# Patient Record
Sex: Female | Born: 1975 | Race: Black or African American | Hispanic: No | Marital: Married | Smoking: Never smoker
Health system: Southern US, Community
[De-identification: ages and names within clinical notes are randomized; demographics above are authoritative.]

## PROBLEM LIST (undated history)

## (undated) DIAGNOSIS — E119 Type 2 diabetes mellitus without complications: Secondary | ICD-10-CM

## (undated) DIAGNOSIS — I2699 Other pulmonary embolism without acute cor pulmonale: Secondary | ICD-10-CM

## (undated) HISTORY — PX: OTHER SURGICAL HISTORY: SHX169

## (undated) HISTORY — PX: ABDOMINAL HYSTERECTOMY: SHX81

## (undated) HISTORY — PX: NASAL SINUS SURGERY: SHX719

---

## 2004-08-18 ENCOUNTER — Emergency Department: Payer: Self-pay | Admitting: Unknown Physician Specialty

## 2005-02-06 ENCOUNTER — Emergency Department: Payer: Self-pay | Admitting: Internal Medicine

## 2005-04-30 ENCOUNTER — Ambulatory Visit: Payer: Self-pay | Admitting: *Deleted

## 2005-05-15 ENCOUNTER — Ambulatory Visit: Payer: Self-pay | Admitting: Obstetrics & Gynecology

## 2005-05-15 ENCOUNTER — Ambulatory Visit (HOSPITAL_COMMUNITY): Admission: RE | Admit: 2005-05-15 | Discharge: 2005-05-15 | Payer: Self-pay | Admitting: *Deleted

## 2005-05-23 ENCOUNTER — Inpatient Hospital Stay (HOSPITAL_COMMUNITY): Admission: AD | Admit: 2005-05-23 | Discharge: 2005-05-23 | Payer: Self-pay | Admitting: Obstetrics and Gynecology

## 2005-06-12 ENCOUNTER — Ambulatory Visit: Payer: Self-pay | Admitting: Obstetrics & Gynecology

## 2005-07-10 ENCOUNTER — Ambulatory Visit: Payer: Self-pay | Admitting: Obstetrics & Gynecology

## 2005-07-19 ENCOUNTER — Ambulatory Visit: Payer: Self-pay | Admitting: *Deleted

## 2005-07-19 ENCOUNTER — Ambulatory Visit (HOSPITAL_COMMUNITY): Admission: RE | Admit: 2005-07-19 | Discharge: 2005-07-19 | Payer: Self-pay | Admitting: *Deleted

## 2005-07-30 ENCOUNTER — Ambulatory Visit (HOSPITAL_COMMUNITY): Admission: RE | Admit: 2005-07-30 | Discharge: 2005-07-30 | Payer: Self-pay | Admitting: *Deleted

## 2005-08-05 ENCOUNTER — Ambulatory Visit: Payer: Self-pay | Admitting: Obstetrics & Gynecology

## 2005-08-06 ENCOUNTER — Ambulatory Visit (HOSPITAL_COMMUNITY): Admission: RE | Admit: 2005-08-06 | Discharge: 2005-08-06 | Payer: Self-pay | Admitting: *Deleted

## 2005-08-15 ENCOUNTER — Ambulatory Visit: Payer: Self-pay | Admitting: *Deleted

## 2005-08-26 ENCOUNTER — Ambulatory Visit: Payer: Self-pay | Admitting: *Deleted

## 2005-09-09 ENCOUNTER — Ambulatory Visit: Payer: Self-pay | Admitting: Family Medicine

## 2005-09-24 ENCOUNTER — Inpatient Hospital Stay (HOSPITAL_COMMUNITY): Admission: AD | Admit: 2005-09-24 | Discharge: 2005-09-24 | Payer: Self-pay | Admitting: Family Medicine

## 2005-09-24 ENCOUNTER — Ambulatory Visit: Payer: Self-pay | Admitting: Family Medicine

## 2005-09-26 ENCOUNTER — Ambulatory Visit (HOSPITAL_COMMUNITY): Admission: RE | Admit: 2005-09-26 | Discharge: 2005-09-26 | Payer: Self-pay | Admitting: *Deleted

## 2005-09-26 ENCOUNTER — Ambulatory Visit: Payer: Self-pay | Admitting: Gynecology

## 2005-10-10 ENCOUNTER — Ambulatory Visit: Payer: Self-pay | Admitting: Gynecology

## 2005-10-24 ENCOUNTER — Ambulatory Visit: Payer: Self-pay | Admitting: *Deleted

## 2005-10-24 ENCOUNTER — Ambulatory Visit (HOSPITAL_COMMUNITY): Admission: RE | Admit: 2005-10-24 | Discharge: 2005-10-24 | Payer: Self-pay | Admitting: Obstetrics & Gynecology

## 2005-10-31 ENCOUNTER — Ambulatory Visit: Payer: Self-pay | Admitting: Gynecology

## 2005-11-07 ENCOUNTER — Ambulatory Visit: Payer: Self-pay | Admitting: Gynecology

## 2005-11-14 ENCOUNTER — Ambulatory Visit: Payer: Self-pay | Admitting: Family Medicine

## 2005-11-21 ENCOUNTER — Ambulatory Visit: Payer: Self-pay | Admitting: Gynecology

## 2005-11-25 ENCOUNTER — Ambulatory Visit (HOSPITAL_COMMUNITY): Admission: RE | Admit: 2005-11-25 | Discharge: 2005-11-25 | Payer: Self-pay | Admitting: Obstetrics & Gynecology

## 2005-11-25 ENCOUNTER — Ambulatory Visit: Payer: Self-pay | Admitting: Obstetrics & Gynecology

## 2005-11-28 ENCOUNTER — Ambulatory Visit: Payer: Self-pay | Admitting: Family Medicine

## 2005-12-02 ENCOUNTER — Ambulatory Visit (HOSPITAL_COMMUNITY): Admission: RE | Admit: 2005-12-02 | Discharge: 2005-12-02 | Payer: Self-pay | Admitting: Obstetrics and Gynecology

## 2005-12-02 ENCOUNTER — Ambulatory Visit: Payer: Self-pay | Admitting: Family Medicine

## 2005-12-03 ENCOUNTER — Ambulatory Visit (HOSPITAL_COMMUNITY): Admission: RE | Admit: 2005-12-03 | Discharge: 2005-12-03 | Payer: Self-pay | Admitting: Family Medicine

## 2005-12-05 ENCOUNTER — Ambulatory Visit: Payer: Self-pay | Admitting: Gynecology

## 2005-12-06 ENCOUNTER — Inpatient Hospital Stay (HOSPITAL_COMMUNITY): Admission: AD | Admit: 2005-12-06 | Discharge: 2005-12-09 | Payer: Self-pay | Admitting: Obstetrics & Gynecology

## 2005-12-06 ENCOUNTER — Ambulatory Visit: Payer: Self-pay | Admitting: Obstetrics & Gynecology

## 2005-12-06 DIAGNOSIS — D689 Coagulation defect, unspecified: Secondary | ICD-10-CM | POA: Insufficient documentation

## 2005-12-10 ENCOUNTER — Encounter: Admission: RE | Admit: 2005-12-10 | Discharge: 2006-01-09 | Payer: Self-pay | Admitting: Unknown Physician Specialty

## 2005-12-13 ENCOUNTER — Ambulatory Visit: Payer: Self-pay | Admitting: Gynecology

## 2005-12-13 ENCOUNTER — Inpatient Hospital Stay: Payer: Self-pay | Admitting: Internal Medicine

## 2006-01-10 ENCOUNTER — Encounter: Admission: RE | Admit: 2006-01-10 | Discharge: 2006-01-17 | Payer: Self-pay | Admitting: Unknown Physician Specialty

## 2006-01-17 ENCOUNTER — Ambulatory Visit: Payer: Self-pay | Admitting: Gynecology

## 2006-01-30 ENCOUNTER — Ambulatory Visit: Payer: Self-pay | Admitting: Obstetrics and Gynecology

## 2006-05-08 ENCOUNTER — Emergency Department: Payer: Self-pay | Admitting: Emergency Medicine

## 2006-05-08 ENCOUNTER — Other Ambulatory Visit: Payer: Self-pay

## 2006-10-10 ENCOUNTER — Ambulatory Visit: Payer: Self-pay | Admitting: Otolaryngology

## 2006-10-14 ENCOUNTER — Ambulatory Visit: Payer: Self-pay | Admitting: Otolaryngology

## 2007-05-02 IMAGING — US US OB FOLLOW-UP
1 series · 13 of 24 positions shown · non-contrast
Comparison: none

CLINICAL DATA: Assigned gestational age is 37 weeks 4 days; oligohydramnios; gestational diabetes; evaluate growth.

[Series 1: us ob follow-up · 0.39mm/px · 13 of 24 slices shown]
[im 1/24]
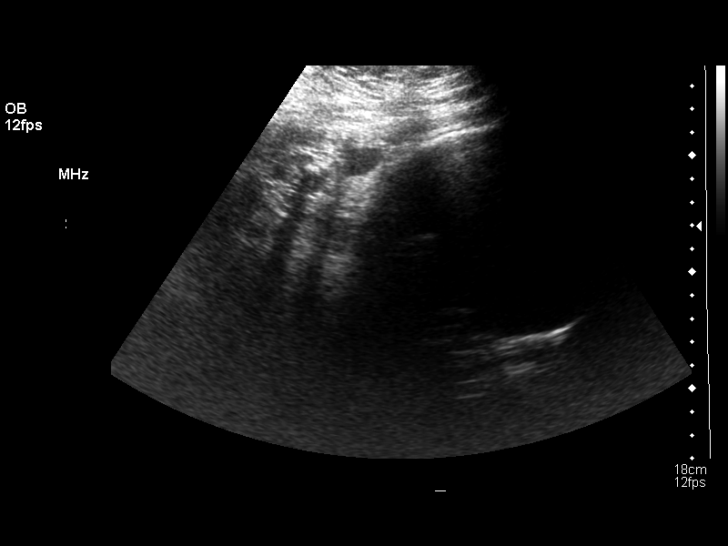
[im 3/24]
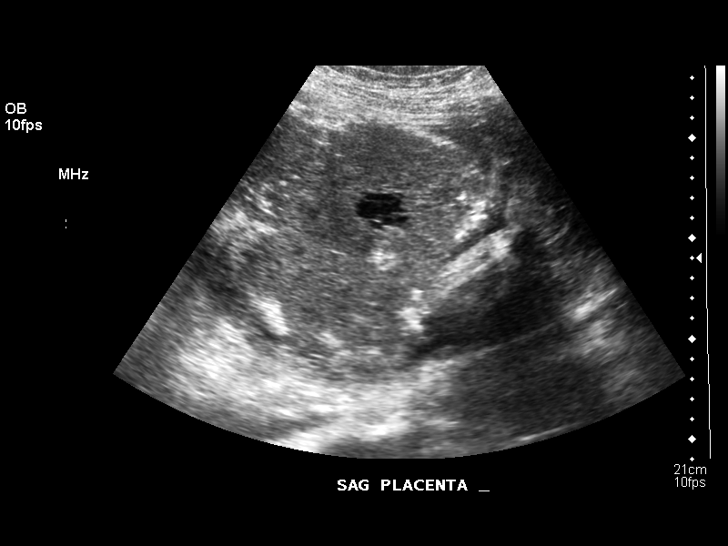
[im 5/24]
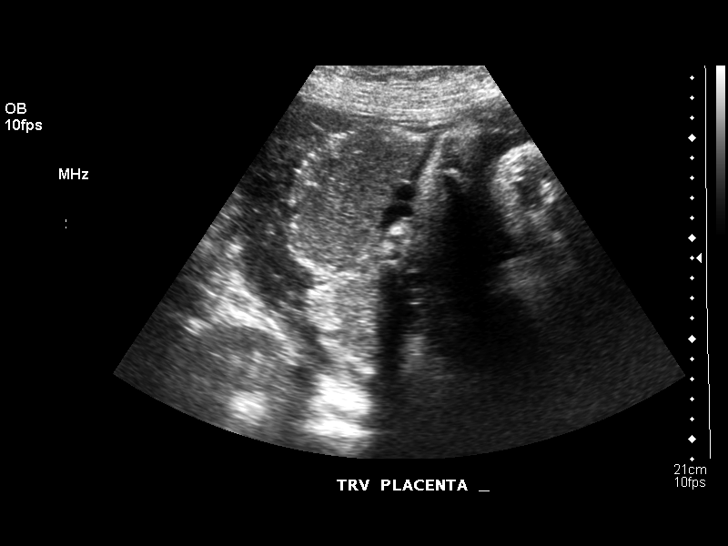
[im 7/24]
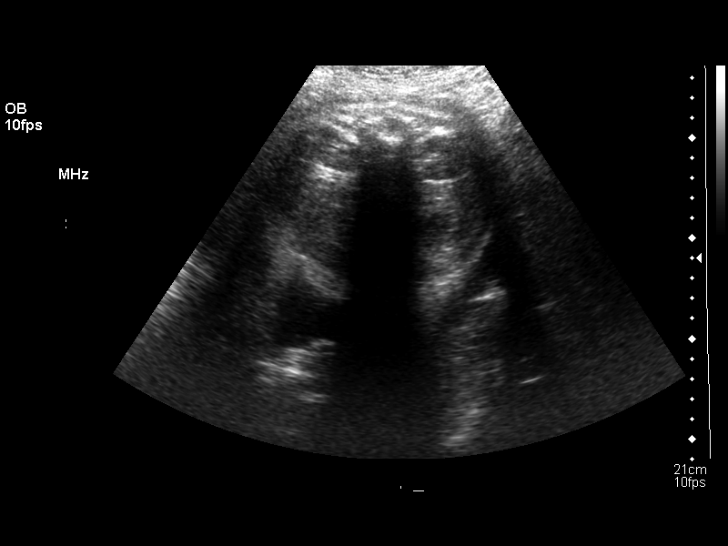
[im 9/24]
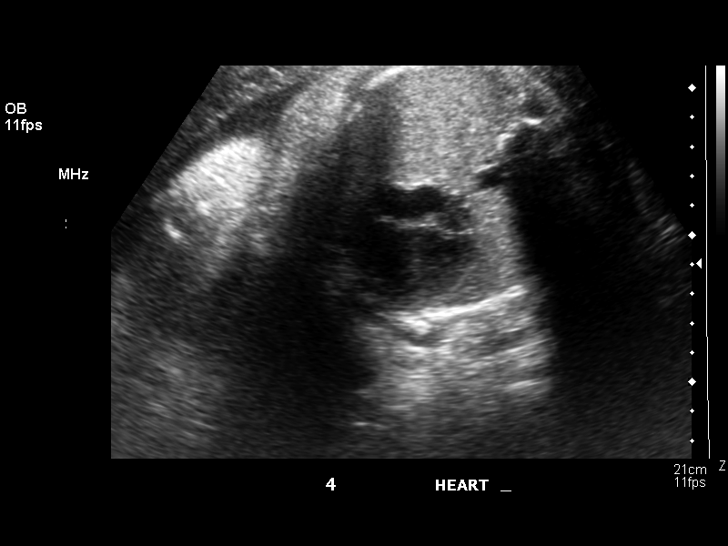
[im 11/24]
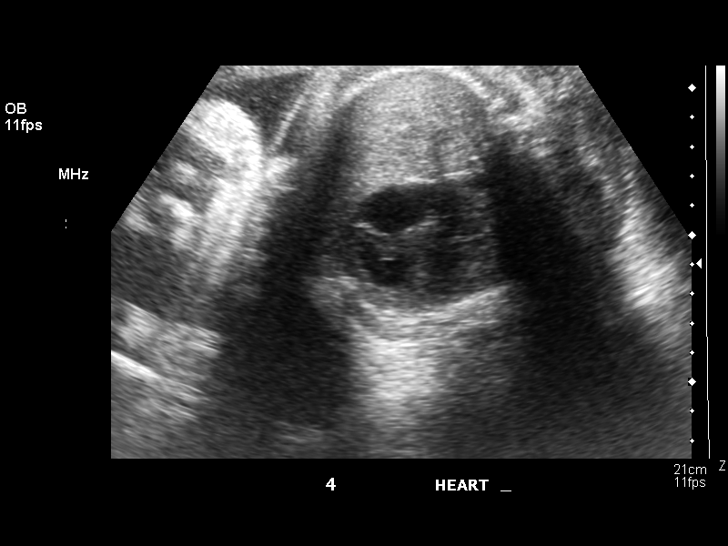
[im 13/24]
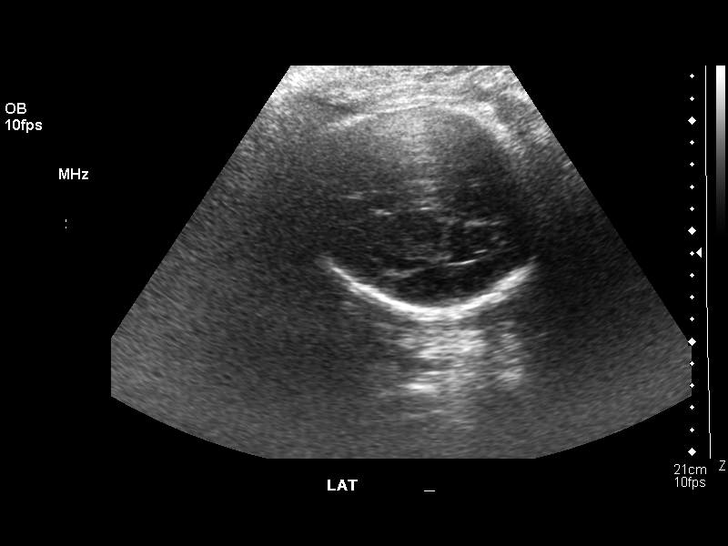
[im 14/24]
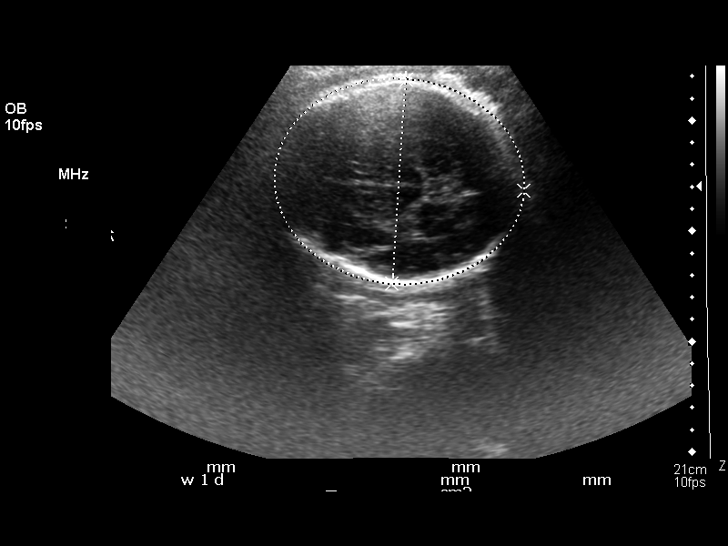
[im 16/24]
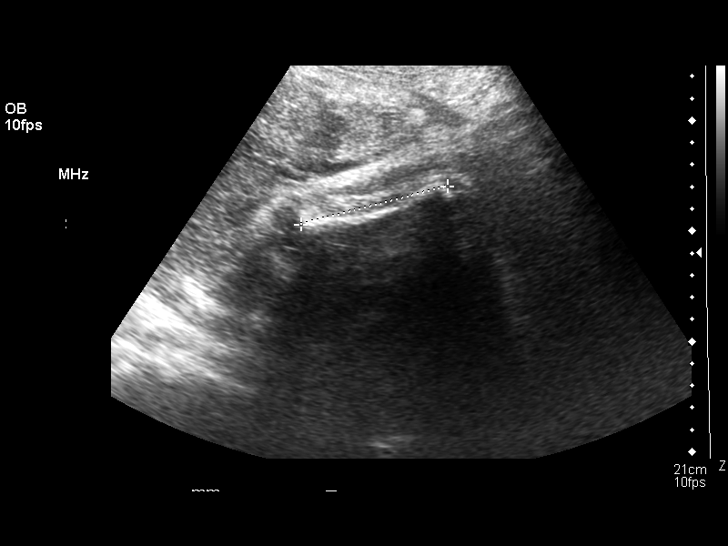
[im 18/24]
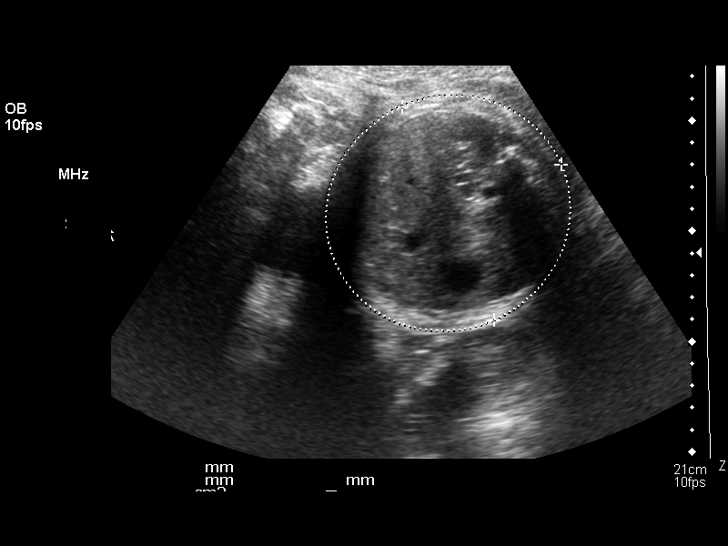
[im 20/24]
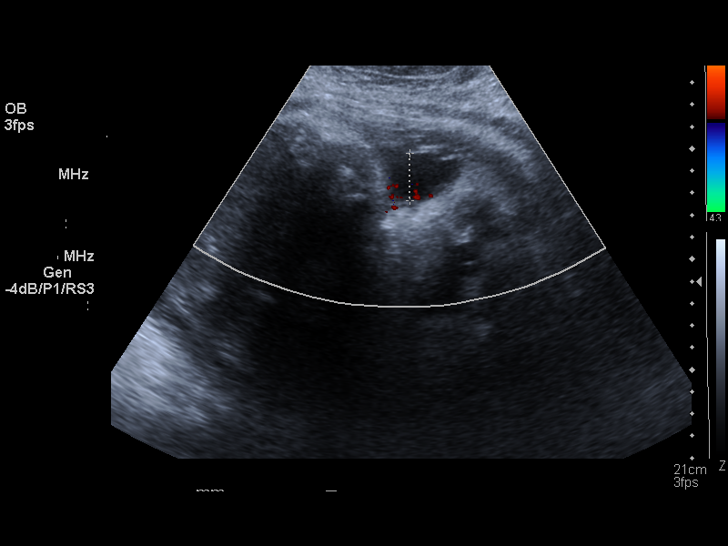
[im 22/24]
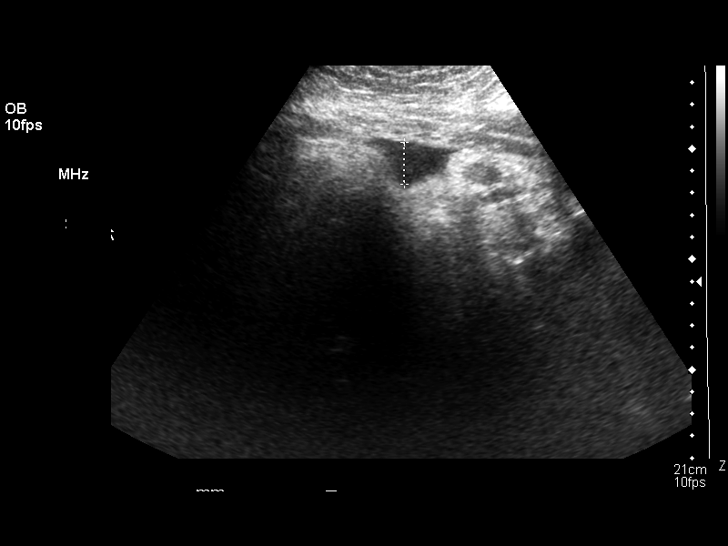
[im 24/24]
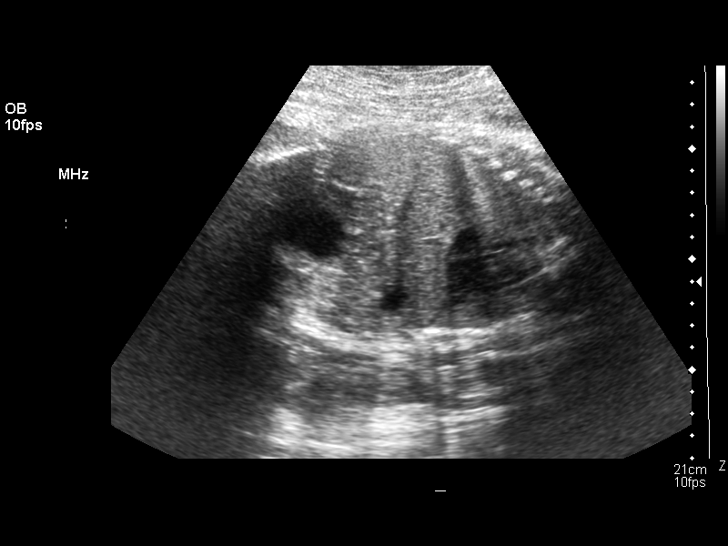

[13 of 24 positions shown; findings below may reference images not displayed]

OBSTETRICAL ULTRASOUND RE-EVALUATION:
 Number of Fetuses:  1
 Heart Rate:  141
 Movement:  Yes
 Breathing:  Yes
 Presentation:  Cephalic
 Placental Location:  Right lateral 
 Grade:  II
 Previa:  No
 Amniotic Fluid (subjective):  Normal
 Amniotic Fluid (objective):  8.8 cm AFI (5TH -95TH%ILE =   7.3 – 23.9 cm for 38 wks)

 FETAL BIOMETRY
 BPD:  9.2 cm  37 w 4 d
 HC:   32.5 cm   36 w 6 d
 AC:  34.5 cm  38 w 3 d
 FL:   6.9 cm   35 w 4 d

 Mean GA:  37 w 1 d  US EDC:  12/23/05
 Assigned GA:  37 w 4 d  Assigned EDC:  12/20/05

 EFW:  8390 g (H) 50th – 75th%ile (2602 – 5798) For 38 wks

 FETAL ANATOMY
 Lateral Ventricles:  Visualized 
 Thalami/CSP:  Previously seen 
 Posterior Fossa:  Previously seen 
 Nuchal Region:  Previously seen 
 Spine:  Previously seen 
 4 Chamber Heart on Left:  Visualized 
 Stomach on Left:  Visualized 
 3 Vessel Cord:  Previously seen 
 Cord Insertion Site:  Previously seen 
 Kidneys:  Visualized 
 Bladder:  Visualized 
 Extremities:  Previously seen 
 MATERNAL UTERINE AND ADNEXAL FINDINGS
 Cervix:  Not evaluated.
IMPRESSION: 1.  There is a single living intrauterine gestation in cephalic presentation.  The mean gestational age by today's ultrasound is 37 weeks 1 day which is concordant with the assigned gestational age.  The estimated fetal weight is 8390 + / - 476 grams which is at the 50th – 75th percentile for a   38 week gestation.  
 2.  The amniotic fluid volume is now within normal limits with a total AFI of 8.8 cm.

## 2007-05-12 IMAGING — CT CT CHEST W/ CM
1 series · 16 of 33 positions shown, 20 images · IV contrast (APPLIED)
Comparison: none

REASON FOR EXAM: chest pain, recent C-section
COMMENTS:

[Series 4: soft tissue · axial · 0.74mm/px · z∈[-234,+44]mm · 16 of 101 slices shown, 20 images]
[im 4/101  mediastinal]
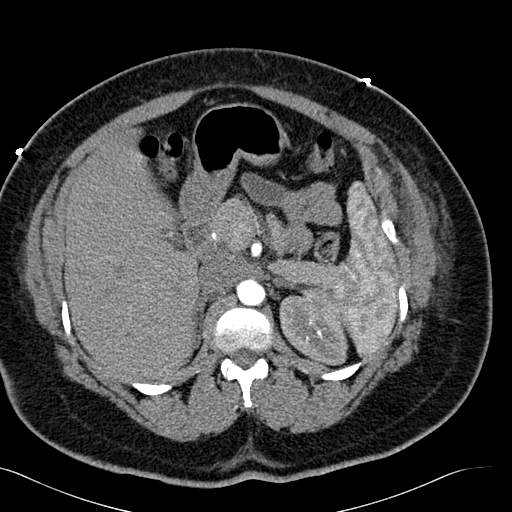
[im 4/101  lung]
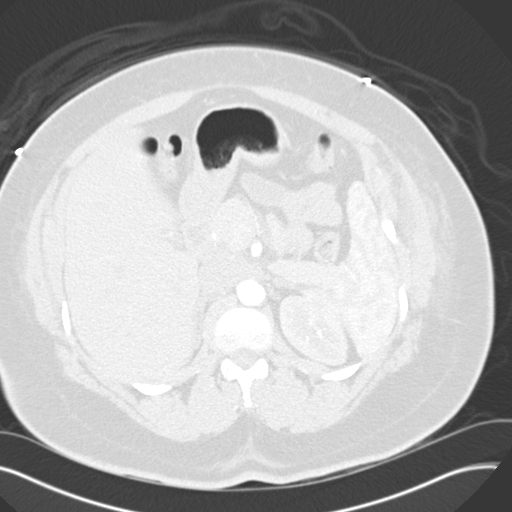
[im 12/101  lung]
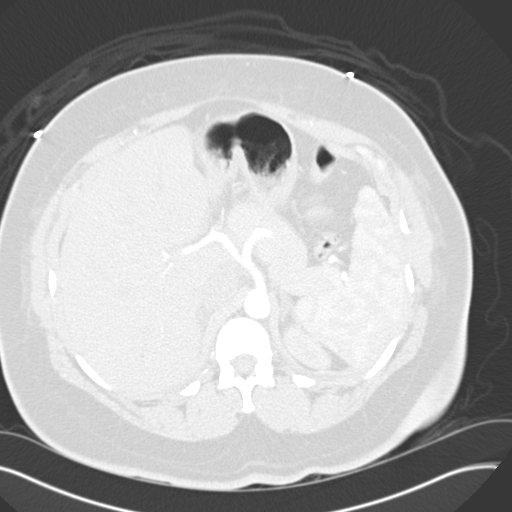
[im 19/101  lung]
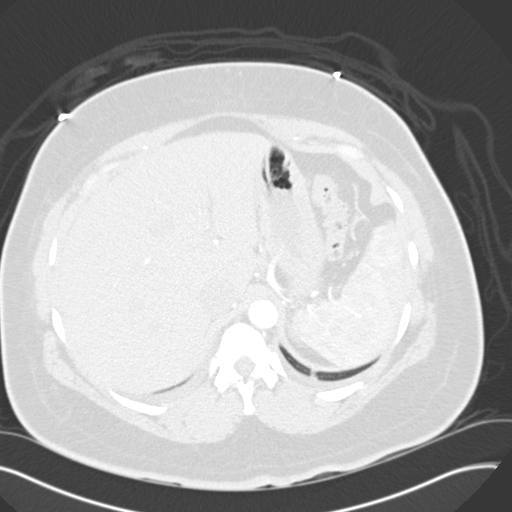
[im 23/101  lung]
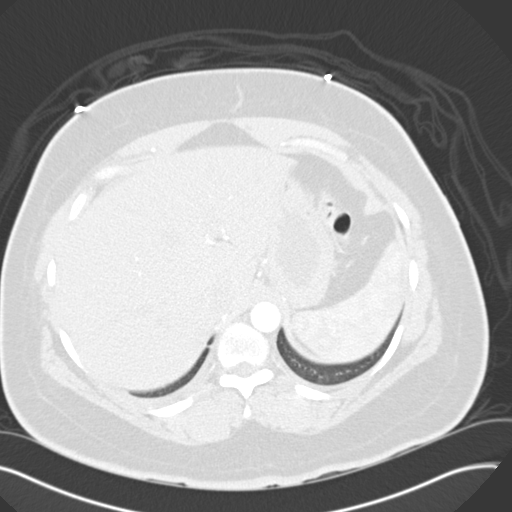
[im 30/101  mediastinal]
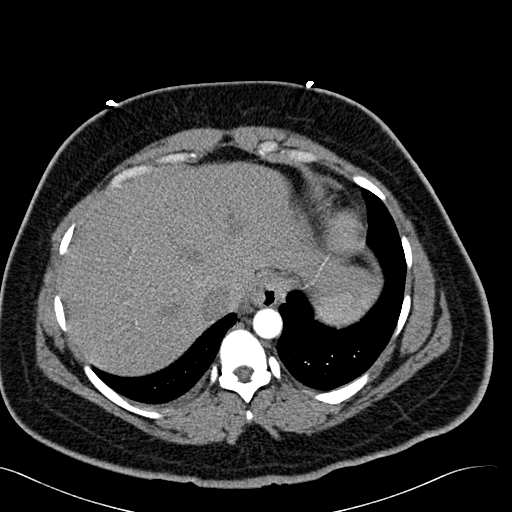
[im 30/101  lung]
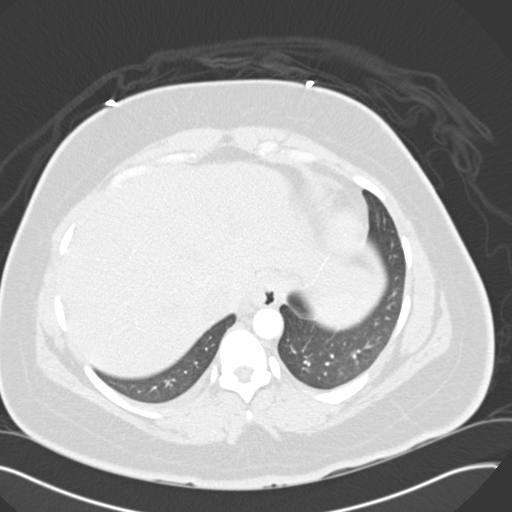
[im 38/101  lung]
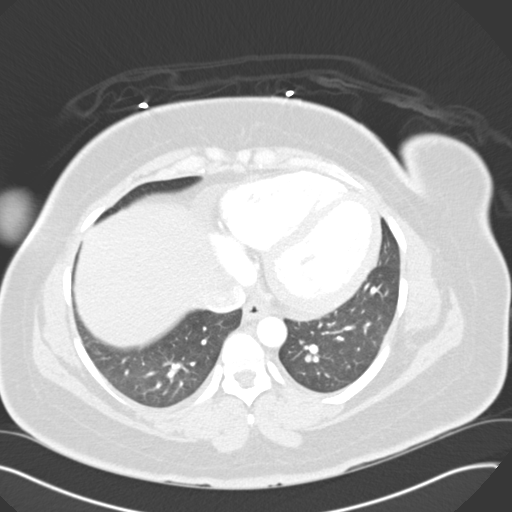
[im 45/101  lung]
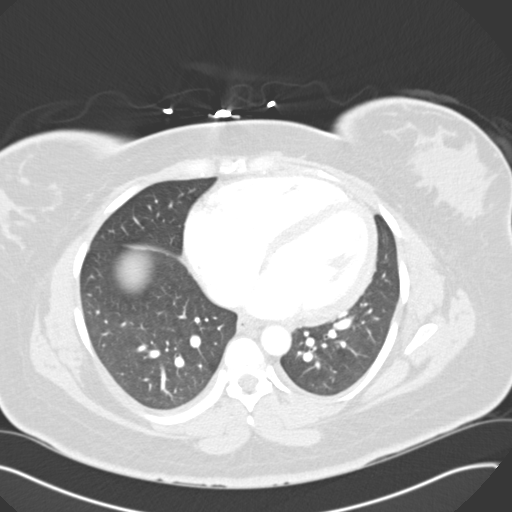
[im 49/101  lung]
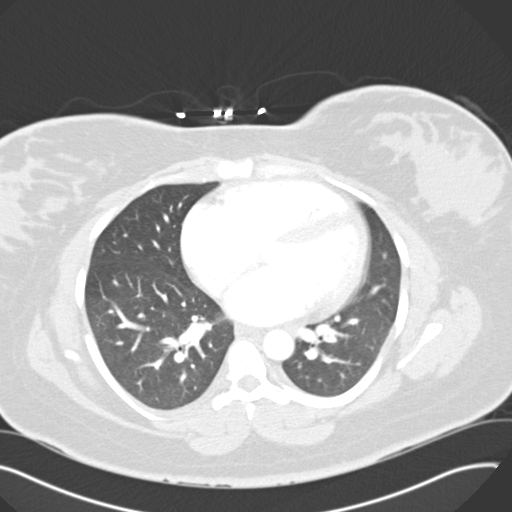
[im 54/101  mediastinal]
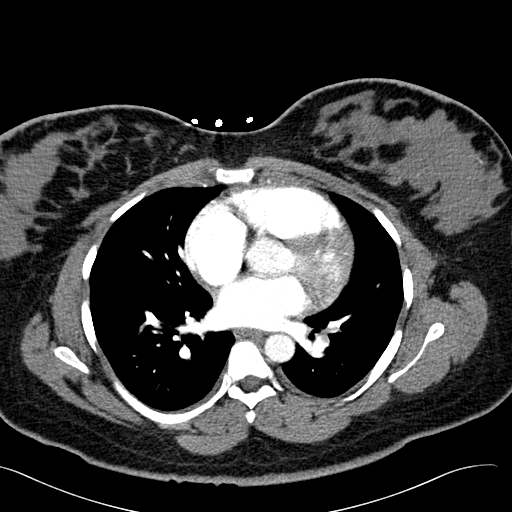
[im 54/101  lung]
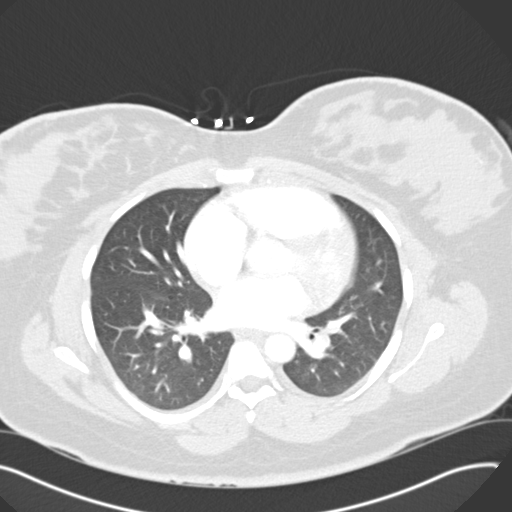
[im 60/101  lung]
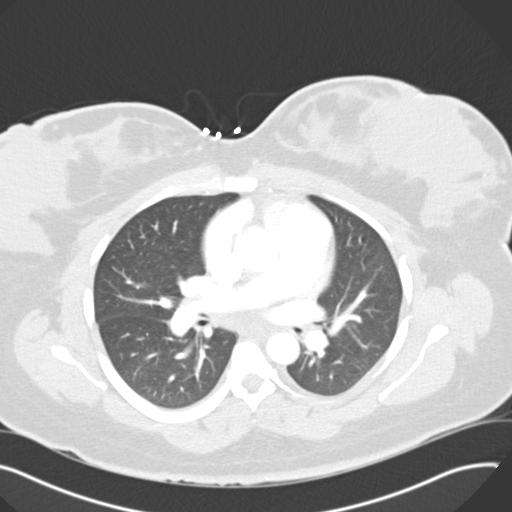
[im 63/101  lung]
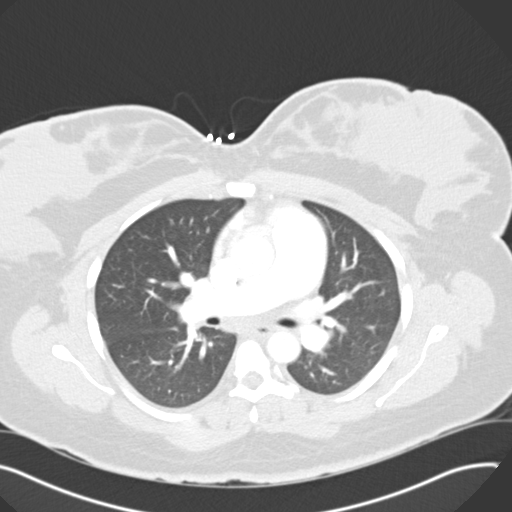
[im 71/101  lung]
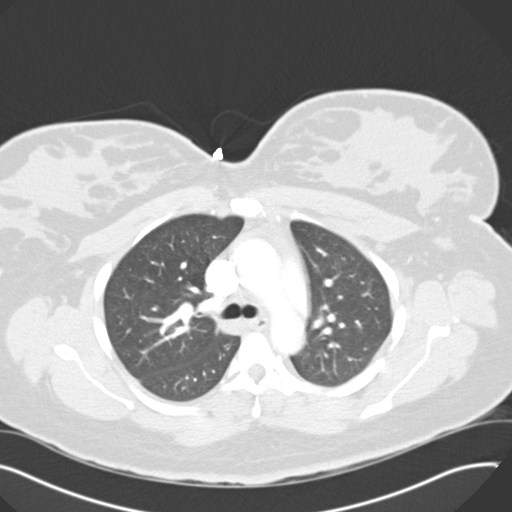
[im 78/101  mediastinal]
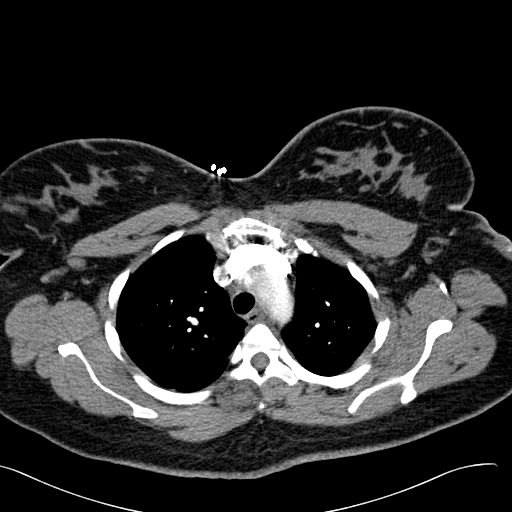
[im 78/101  lung]
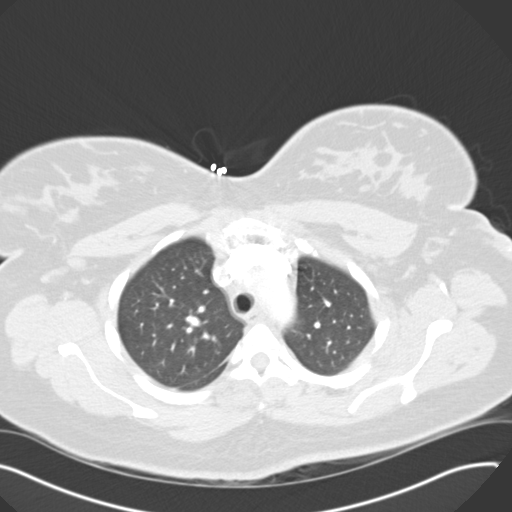
[im 82/101  lung]
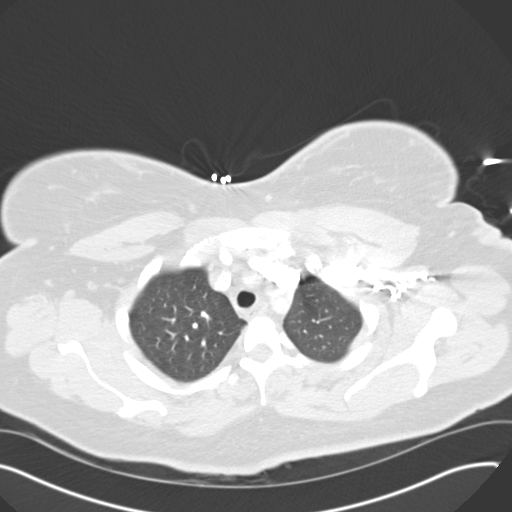
[im 89/101  lung]
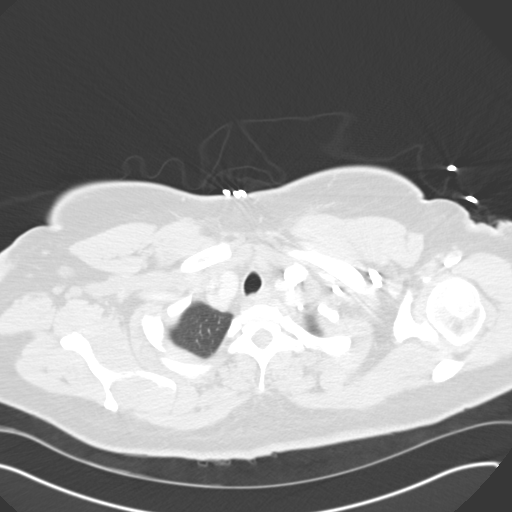
[im 97/101  lung]
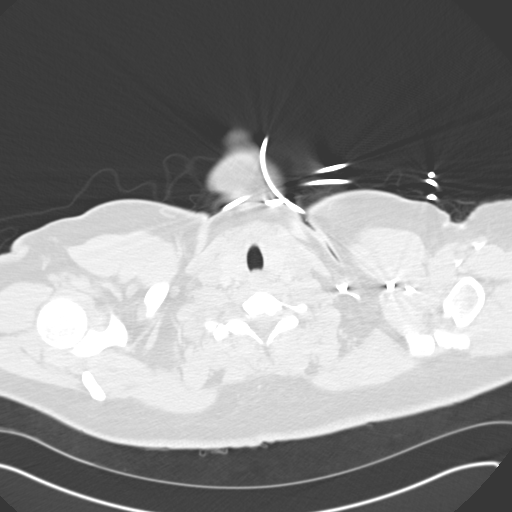

[16 of 33 positions shown; findings below may reference images not displayed]

PROCEDURE:     CT  - CT CHEST (FOR PE) W  - December 13, 2005  [DATE]

RESULT:          An emergent chest CT was performed for chest pain.

There is noted a good bolus of contrast in the pulmonary arteries.  There is
seen a filling defect in the descending pulmonary artery posterior segment
on the LEFT which can be consistent with a pulmonary embolus.  No additional
pulmonary emboli are seen.

On the mediastinal window settings, no mediastinal masses are identified.

On the lung window settings, the lung fields appear clear, and no effusions
are identified.
IMPRESSION: CV:  Findings which can be consistent with pulmonary
embolus to the LEFT lower lobe posterior segment.

The report was called to the emergency room at the conclusion of the
dictation.

## 2007-05-13 IMAGING — US US EXTREM LOW VENOUS BILAT
1 series · 18 of 24 positions shown · non-contrast
Comparison: none

REASON FOR EXAM: PE
COMMENTS:

PROCEDURE:     US  - US DOPPLER LOW EXTR BILATERAL  - December 14, 2005  [DATE]
RESULT:          There is noted flow throughout the deep venous systems
bilaterally.  The veins are compressible.  No clot is identified.

[Series 1: us extrem low venous bilat · 18 of 50 slices shown]
[im 1/50]
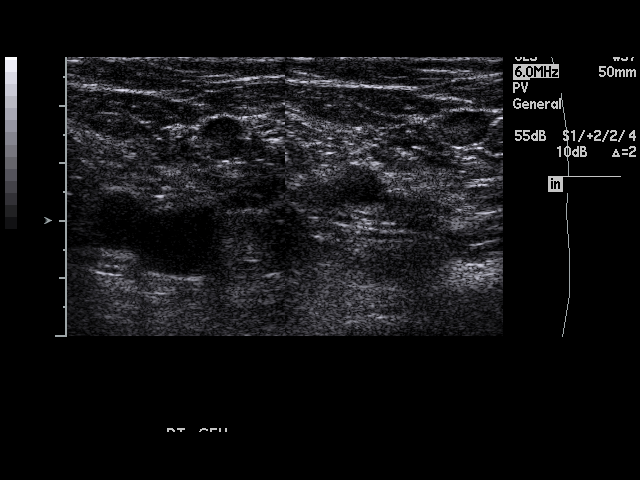
[im 5/50]
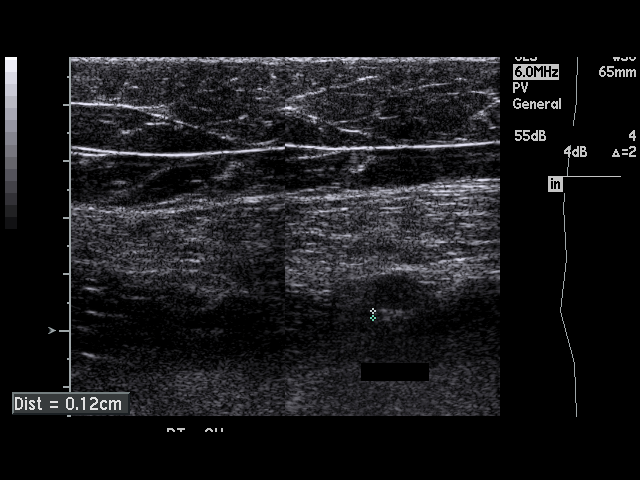
[im 7/50]
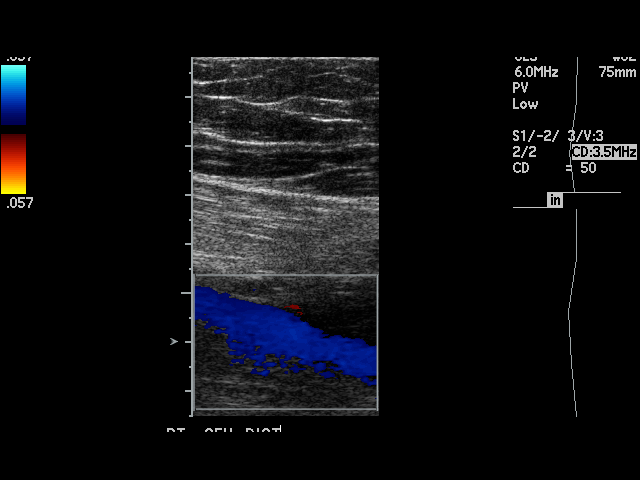
[im 9/50]
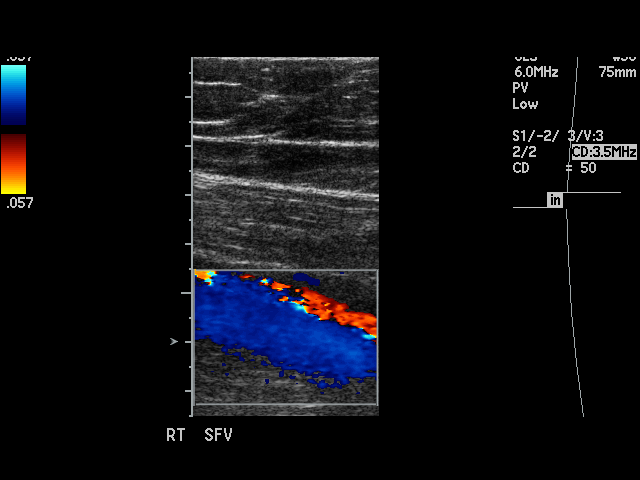
[im 13/50]
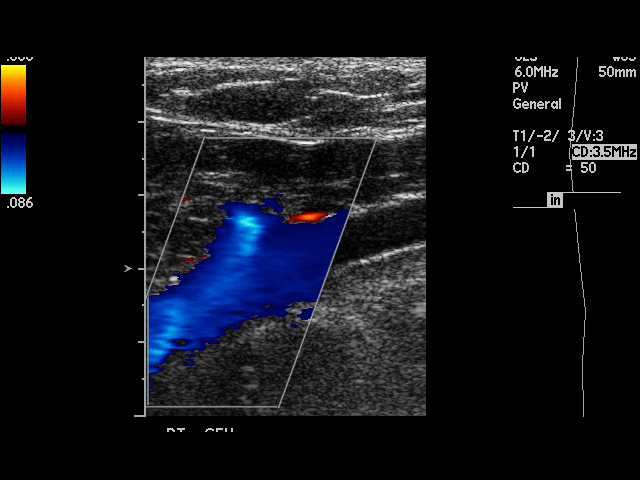
[im 15/50]
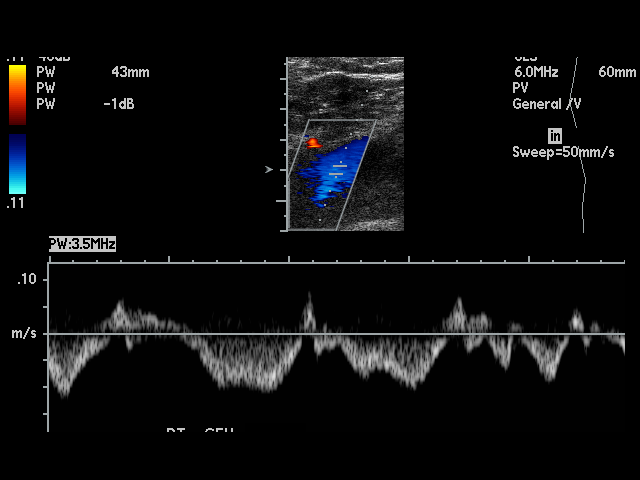
[im 18/50]
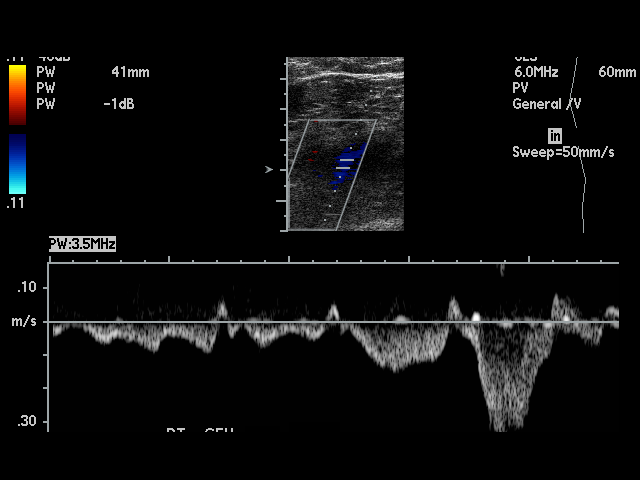
[im 22/50]
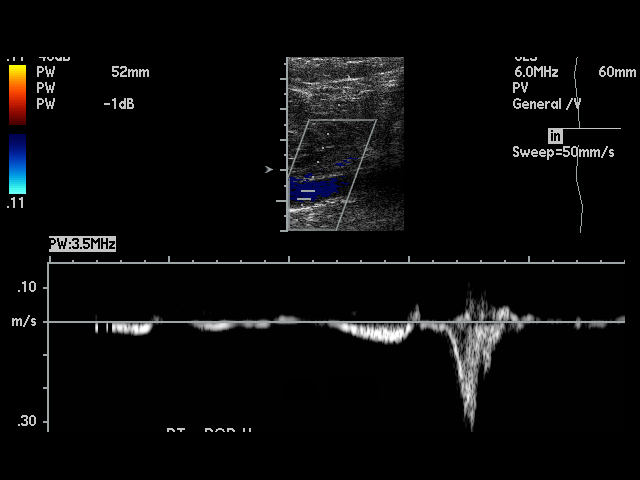
[im 24/50]
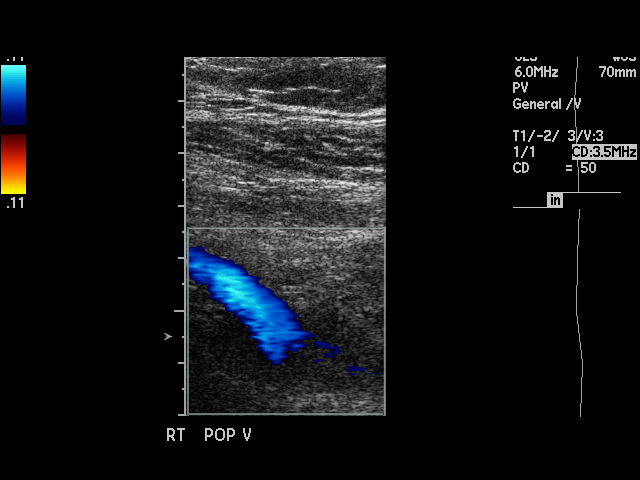
[im 26/50]
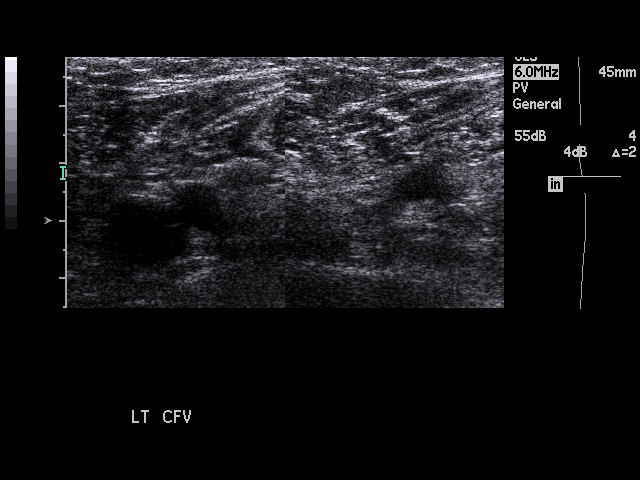
[im 30/50]
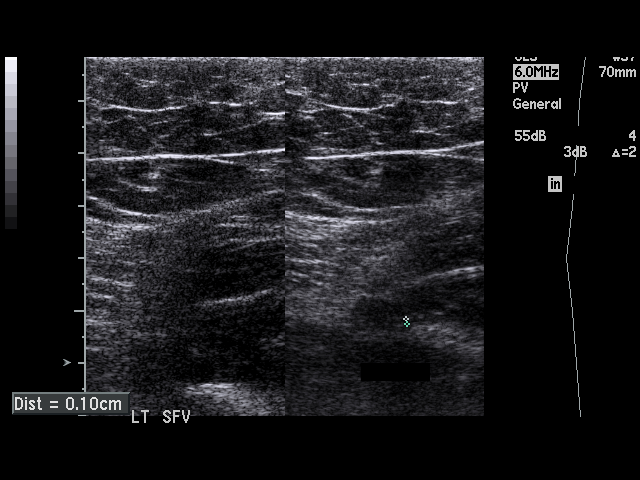
[im 32/50]
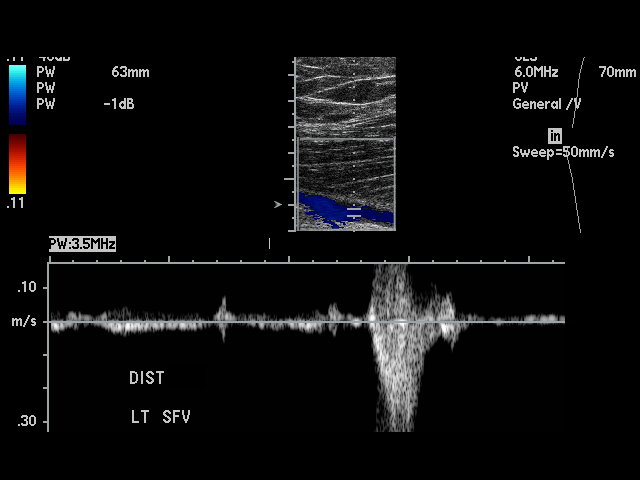
[im 35/50]
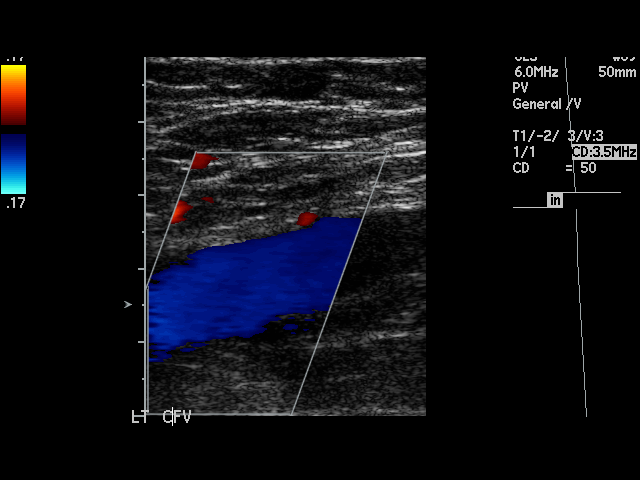
[im 39/50]
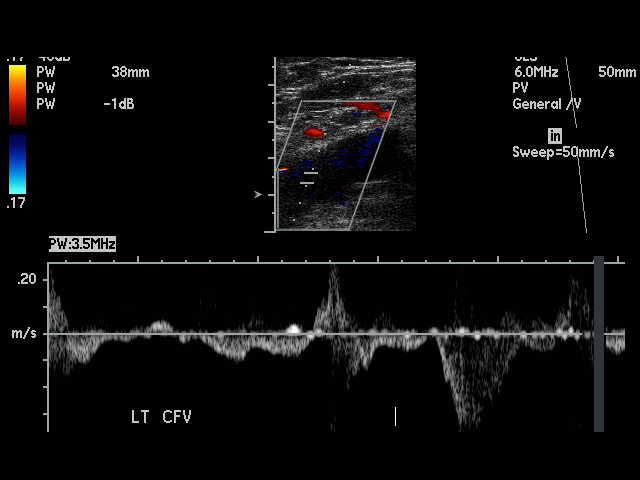
[im 41/50]
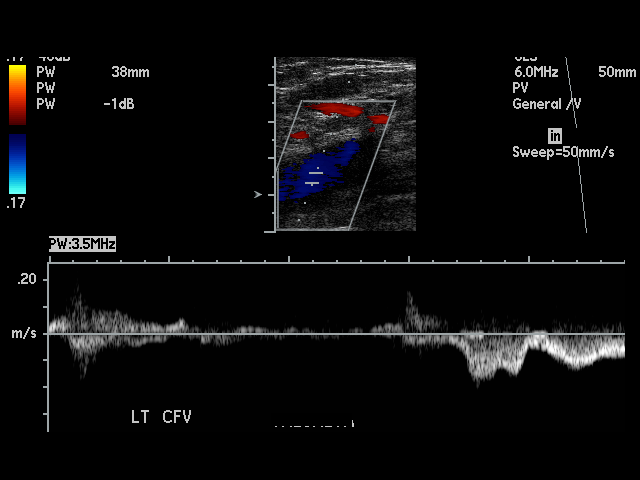
[im 43/50]
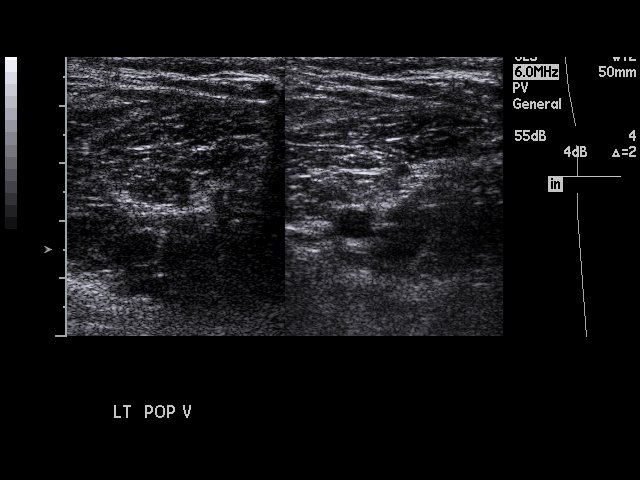
[im 47/50]
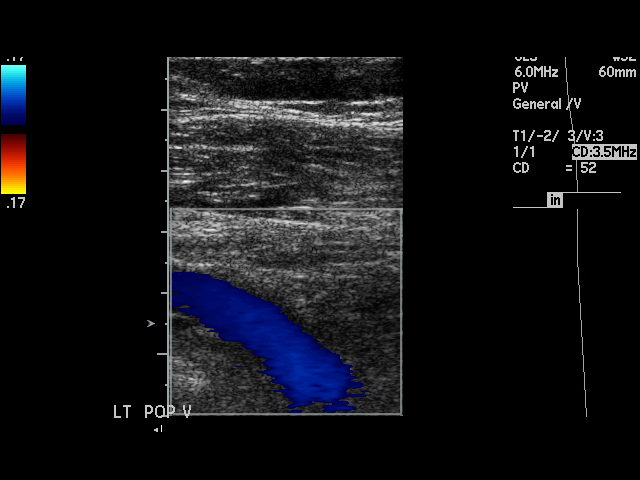
[im 50/50]
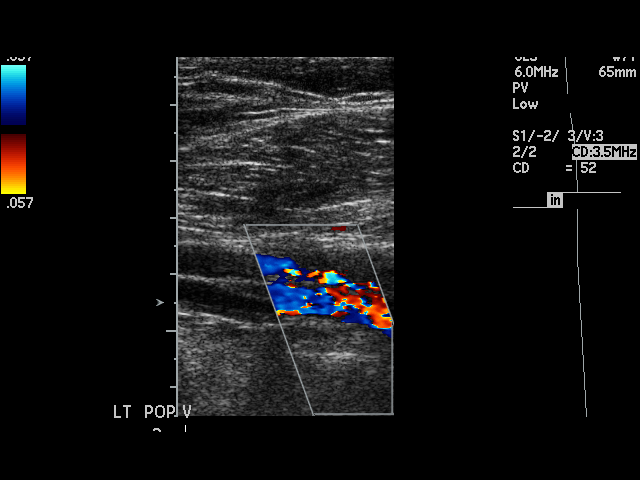

[18 of 24 positions shown; findings below may reference images not displayed]

IMPRESSION: No evidence of deep venous thrombosis identified in
either lower extremity.

## 2007-10-20 ENCOUNTER — Emergency Department: Payer: Self-pay | Admitting: Emergency Medicine

## 2007-10-20 ENCOUNTER — Other Ambulatory Visit: Payer: Self-pay

## 2007-10-22 ENCOUNTER — Ambulatory Visit: Payer: Self-pay | Admitting: Gastroenterology

## 2009-02-03 ENCOUNTER — Ambulatory Visit: Payer: Self-pay | Admitting: Family Medicine

## 2009-03-03 ENCOUNTER — Emergency Department: Payer: Self-pay | Admitting: Emergency Medicine

## 2009-03-16 ENCOUNTER — Ambulatory Visit: Payer: Self-pay | Admitting: Unknown Physician Specialty

## 2009-03-22 ENCOUNTER — Ambulatory Visit: Payer: Self-pay | Admitting: Unknown Physician Specialty

## 2009-06-21 ENCOUNTER — Ambulatory Visit: Payer: Self-pay | Admitting: Unknown Physician Specialty

## 2010-06-10 ENCOUNTER — Encounter: Payer: Self-pay | Admitting: *Deleted

## 2011-07-08 ENCOUNTER — Other Ambulatory Visit: Payer: Self-pay | Admitting: Family Medicine

## 2011-07-08 LAB — HEMOGLOBIN A1C: Hemoglobin A1C: 6.2 % (ref 4.2–6.3)

## 2011-07-08 LAB — CBC WITH DIFFERENTIAL/PLATELET
Basophil %: 0.3 %
Eosinophil #: 0.1 10*3/uL (ref 0.0–0.7)
HCT: 33.6 % — ABNORMAL LOW (ref 35.0–47.0)
HGB: 11.2 g/dL — ABNORMAL LOW (ref 12.0–16.0)
MCH: 30 pg (ref 26.0–34.0)
MCHC: 33.2 g/dL (ref 32.0–36.0)
MCV: 91 fL (ref 80–100)
Monocyte #: 0.4 10*3/uL (ref 0.0–0.7)
Neutrophil #: 3.5 10*3/uL (ref 1.4–6.5)

## 2012-01-06 DIAGNOSIS — R768 Other specified abnormal immunological findings in serum: Secondary | ICD-10-CM | POA: Insufficient documentation

## 2012-08-13 DIAGNOSIS — Z86711 Personal history of pulmonary embolism: Secondary | ICD-10-CM | POA: Insufficient documentation

## 2013-04-04 DIAGNOSIS — L83 Acanthosis nigricans: Secondary | ICD-10-CM | POA: Insufficient documentation

## 2013-04-04 DIAGNOSIS — E010 Iodine-deficiency related diffuse (endemic) goiter: Secondary | ICD-10-CM | POA: Insufficient documentation

## 2013-04-04 DIAGNOSIS — E119 Type 2 diabetes mellitus without complications: Secondary | ICD-10-CM | POA: Insufficient documentation

## 2013-08-13 DIAGNOSIS — M25569 Pain in unspecified knee: Secondary | ICD-10-CM | POA: Insufficient documentation

## 2013-08-27 DIAGNOSIS — M1710 Unilateral primary osteoarthritis, unspecified knee: Secondary | ICD-10-CM | POA: Insufficient documentation

## 2013-08-27 DIAGNOSIS — M179 Osteoarthritis of knee, unspecified: Secondary | ICD-10-CM | POA: Insufficient documentation

## 2014-06-01 DIAGNOSIS — D6859 Other primary thrombophilia: Secondary | ICD-10-CM | POA: Insufficient documentation

## 2015-05-19 ENCOUNTER — Emergency Department
Admission: EM | Admit: 2015-05-19 | Discharge: 2015-05-19 | Disposition: A | Discharge status: Home / Self Care | Payer: Federal, State, Local not specified - PPO | Attending: Emergency Medicine | Admitting: Emergency Medicine

## 2015-05-19 ENCOUNTER — Encounter: Payer: Self-pay | Admitting: Emergency Medicine

## 2015-05-19 ENCOUNTER — Emergency Department: Payer: Federal, State, Local not specified - PPO

## 2015-05-19 DIAGNOSIS — J209 Acute bronchitis, unspecified: Secondary | ICD-10-CM | POA: Insufficient documentation

## 2015-05-19 DIAGNOSIS — E119 Type 2 diabetes mellitus without complications: Secondary | ICD-10-CM | POA: Insufficient documentation

## 2015-05-19 DIAGNOSIS — J0101 Acute recurrent maxillary sinusitis: Secondary | ICD-10-CM | POA: Diagnosis not present

## 2015-05-19 DIAGNOSIS — J40 Bronchitis, not specified as acute or chronic: Secondary | ICD-10-CM

## 2015-05-19 DIAGNOSIS — R079 Chest pain, unspecified: Secondary | ICD-10-CM | POA: Diagnosis present

## 2015-05-19 HISTORY — DX: Type 2 diabetes mellitus without complications: E11.9

## 2015-05-19 HISTORY — DX: Other pulmonary embolism without acute cor pulmonale: I26.99

## 2015-05-19 LAB — CBC
HCT: 36 % (ref 35.0–47.0)
Hemoglobin: 12.1 g/dL (ref 12.0–16.0)
MCH: 29 pg (ref 26.0–34.0)
MCHC: 33.6 g/dL (ref 32.0–36.0)
MCV: 86.2 fL (ref 80.0–100.0)
PLATELETS: 308 10*3/uL (ref 150–440)
RBC: 4.17 MIL/uL (ref 3.80–5.20)
RDW: 14 % (ref 11.5–14.5)
WBC: 10.4 10*3/uL (ref 3.6–11.0)

## 2015-05-19 LAB — TROPONIN I: Troponin I: <0.03 ng/mL (ref <0.031)

## 2015-05-19 LAB — BASIC METABOLIC PANEL
Anion gap: 6 (ref 5–15)
BUN: 12 mg/dL (ref 6–20)
CALCIUM: 9.4 mg/dL (ref 8.9–10.3)
CO2: 28 mmol/L (ref 22–32)
CREATININE: 0.73 mg/dL (ref 0.44–1.00)
Chloride: 105 mmol/L (ref 101–111)
GFR calc Af Amer: >60 mL/min (ref >60)
GLUCOSE: 121 mg/dL — AB (ref 65–99)
Potassium: 3.9 mmol/L (ref 3.5–5.1)
SODIUM: 139 mmol/L (ref 135–145)

## 2015-05-19 LAB — FIBRIN DERIVATIVES D-DIMER (ARMC ONLY): FIBRIN DERIVATIVES D-DIMER (ARMC): 413 (ref 0–499)

## 2015-05-19 MED ORDER — LEVOFLOXACIN 500 MG PO TABS
ORAL_TABLET | ORAL | Status: AC
  Administered 2015-05-19: 500 mg via ORAL
  Filled 2015-05-19: qty 1

## 2015-05-19 MED ORDER — LEVOFLOXACIN 500 MG PO TABS
500.0000 mg | ORAL_TABLET | Freq: Once | ORAL | Status: AC
  Administered 2015-05-19: 500 mg via ORAL

## 2015-05-19 MED ORDER — ASPIRIN 81 MG PO CHEW
324.0000 mg | CHEWABLE_TABLET | Freq: Once | ORAL | Status: AC
  Administered 2015-05-19: 324 mg via ORAL
  Filled 2015-05-19: qty 4

## 2015-05-19 MED ORDER — LEVOFLOXACIN 500 MG PO TABS: 500.0000 mg | ORAL_TABLET | Freq: Every day | ORAL | Status: AC

## 2015-05-19 MED ORDER — FLUCONAZOLE 100 MG PO TABS: 100.0000 mg | ORAL_TABLET | Freq: Every day | ORAL | Status: DC

## 2015-05-19 NOTE — ED Provider Notes (Signed)
[facility] Emergency Department Provider Note REMINDER - THIS NOTE IS NOT A FINAL MEDICAL RECORD UNTIL IT IS SIGNED. UNTIL THEN, THE CONTENT BELOW MAY REFLECT INFORMATION FROM A DOCUMENTATION TEMPLATE, NOT THE ACTUAL PATIENT VISIT. ____________________________________________  Time seen: Approximately 5:58 PM  I have reviewed the triage vital signs and the nursing notes.   HISTORY  Chief Complaint Chest Pain    HPI Wendy Gray is a 39 y.o. female percents for evaluation of anterior chest discomfort for last couple days. She describes that she has intermittent aching sensation over the middle of her chest for about the last 3 days. She reports that she's not had any shortness of breath, nausea vomiting or sweating. She has had a recent sinus infection has been treated with Augmentin, and thinks that this could be from something like a mild bronchitis associated. She continues to endorse feeling fullness nasal congestion. She denies any abdominal pain. She's not have any sharp pain or trouble breathing. No swelling in her legs. No recent trips, travel, use of estrogens, or smoking. She does have a previous history of pulmonary embolism and describes what sounds like a provoked pulmonary embolism after hysterectomy following a pregnancy.   Past Medical History  Diagnosis Date  . Diabetes mellitus without complication (HCC)   . Pulmonary embolism (HCC)     There are no active problems to display for this patient.   Past Surgical History  Procedure Laterality Date  . C sections    . Abdominal hysterectomy      08/2012  . Nasal sinus surgery      Current Outpatient Rx  Name  Route  Sig  Dispense  Refill  . fluconazole (DIFLUCAN) 100 MG tablet   Oral   Take 1 tablet (100 mg total) by mouth daily. Take 1 tablet, then if persistent symptoms take a second tablet in 3 days   2 tablet   0   . levofloxacin (LEVAQUIN) 500 MG tablet   Oral   Take 1  tablet (500 mg total) by mouth daily.   5 tablet   0     Allergies Review of patient's allergies indicates no known allergies.  No family history on file.  Social History Social History  Substance Use Topics  . Smoking status: Never Smoker   . Smokeless tobacco: Never Used  . Alcohol Use: No    Review of Systems Constitutional: No fever/chills Eyes: No visual changes. ENT: No sore throat. Throat is slightly scratchy. Continues to have runny nose. See history of present illness. Cardiovascular: Denies heavy chest pressure. Reports a slight achiness in the front of the chest. Respiratory: Denies shortness of breath. Occasional dry cough nonproductive for the last few days. Gastrointestinal: No abdominal pain.  No nausea, no vomiting.  No diarrhea.  No constipation. Genitourinary: Negative for dysuria. Musculoskeletal: Negative for back pain. Skin: Negative for rash. Neurological: Negative for headaches, focal weakness or numbness.  10-point ROS otherwise negative.  ____________________________________________   PHYSICAL EXAM:  VITAL SIGNS: ED Triage Vitals  Enc Vitals Group     BP 05/19/15 1647 127/73 mmHg     Pulse Rate 05/19/15 1647 84     Resp 05/19/15 1647 16     Temp 05/19/15 1647 98.1 F (36.7 C)     Temp Source 05/19/15 1647 Oral     SpO2 05/19/15 1647 98 %     Weight 05/19/15 1647 268 lb (121.564 kg)     Height 05/19/15 1647  (  1.6 m)     Head Cir --      Peak Flow --      Pain Score 05/19/15 1643 5     Pain Loc --      Pain Edu? --      Excl. in GC? --    Constitutional: Alert and oriented. Well appearing and in no acute distress. Eyes: Conjunctivae are normal. PERRL. EOMI. Head: Atraumatic. Nose: Patient does have congestion, mild rhinorrhea without purulence. Patient does have tenderness over the maxillary sinuses anterior bilateral. Mouth/Throat: Mucous membranes are moist.  Oropharynx non-erythematous. Neck: No stridor.   Cardiovascular:  Normal rate, regular rhythm. Grossly normal heart sounds.  Good peripheral circulation. Respiratory: Normal respiratory effort.  No retractions. Lungs CTAB. Gastrointestinal: Soft and nontender. No distention. No abdominal bruits. No CVA tenderness. Musculoskeletal: No lower extremity tenderness nor edema.  No joint effusions. Neurologic:  Normal speech and language. No gross focal neurologic deficits are appreciated. No gait instability. Skin:  Skin is warm, dry and intact. No rash noted. Psychiatric: Mood and affect are normal. Speech and behavior are normal.  ____________________________________________   LABS (all labs ordered are listed, but only abnormal results are displayed)  Labs Reviewed  BASIC METABOLIC PANEL - Abnormal; Notable for the following:    Glucose, Bld 121 (*)    All other components within normal limits  CBC  TROPONIN I  FIBRIN DERIVATIVES D-DIMER ([facility] ONLY)  TROPONIN I   ____________________________________________  EKG  Reviewed and interpreted by me at 1700 Ventricular rate 85 PR 150 QRS 90 QTc 440 Probable left ventricular hypertrophy, minimal T-wave inversion noted in V2. No acute ST elevation. Compared with previous EKG from June 2009 no significant changes noted. ____________________________________________  RADIOLOGY  US Venous Img Lower Bilateral (Final result) Result time: 05/19/15 19:03:29   Final result by Rad Results In Interface (05/19/15 19:03:29)   Narrative:   CLINICAL DATA: 39 year old female with chest pain and history of pulmonary emboli.  EXAM: BILATERAL LOWER EXTREMITY VENOUS DOPPLER ULTRASOUND  TECHNIQUE: Gray-scale sonography with graded compression, as well as color Doppler and duplex ultrasound were performed to evaluate the lower extremity deep venous systems from the level of the common femoral vein and including the common femoral, femoral, profunda femoral, popliteal and calf veins including the posterior  tibial, peroneal and gastrocnemius veins when visible. The superficial great saphenous vein was also interrogated. Spectral Doppler was utilized to evaluate flow at rest and with distal augmentation maneuvers in the common femoral, femoral and popliteal veins.  COMPARISON: None.  FINDINGS: Deep venous system appears patent and compressible from groin through popliteal fossae bilaterally.  Spontaneous venous flow present with evidence of respiratory phasicity. Augmentation intact.  No intraluminal thrombus identified.  Visualized portions of the greater saphenous veins patent bilaterally.  IMPRESSION: No evidence of deep venous thrombosis.   Electronically Signed By: Harmon Pier M.D. On: 05/19/2015 19:03          DG Chest 2 View (Final result) Result time: 05/19/15 17:48:28   Final result by Rad Results In Interface (05/19/15 17:48:28)   Narrative:   CLINICAL DATA: Initial encounter for 1 month history of cough and midsternal chest pain since Monday of this week.  EXAM: CHEST 2 VIEW  COMPARISON: 02/03/2009.  FINDINGS: The heart size and mediastinal contours are within normal limits. Both lungs are clear. The visualized skeletal structures are unremarkable.  IMPRESSION: No active cardiopulmonary disease.     ____________________________________________   PROCEDURES  Procedure(s) performed: None  Critical  Care performed: No  ____________________________________________   INITIAL IMPRESSION / ASSESSMENT AND PLAN / ED COURSE  Pertinent labs & imaging results that were available during my care of the patient were reviewed by me and considered in my medical decision making (see chart for details).  Patient presents for somewhat atypical chest discomfort that is not described as pressure, not associated with nausea vomiting or diaphoresis. No radiation of pain. She has a previous history of pulmonary emboli, but the symptoms sound even very  atypical for that having a recent upper respiratory infection and dry nonproductive cough with runny nose and sinus discomfort.  Primary pressure and this is likely mild bronchitis and upper respiratory infection with sinusitis, however given her previous history of pulmonary embolism and review of her well's criteria she is low risk and we'll screen V d-dimer as well as add on ultrasound of lower extremities to assess for DVT and she reported with previous pulmonary emboli. Heart score is low risk, in addition her EKG shows no changes. Troponin is negative. Continue to follow her clinically in the emergency room as we rule out condition such as pulmonary emboli, dissection, pneumothorax, ACS, etc.  ----------------------------------------- 8:21 PM on 05/19/2015 -----------------------------------------  Second troponin also negative. Doppler negative for DVT. At this point, suspect the patient likely having mild bronchitis with sinusitis. She does request treatment with antibiotics, and has a history of chronic sinusitis requiring antibiotic treatment. I will give her a prescription for Levaquin and we did discuss the risks benefits and side effects. In addition, she requests Diflucan as she has a history of vaginosis developing after treatment with antibiotics for sinus infection. I will give her this prescription, she will take it only if she needs. She will follow up closely with her doctor, careful chest pain return precautions discussed the patient is very agreeable. ____________________________________________   FINAL CLINICAL IMPRESSION(S) / ED DIAGNOSES  Final diagnoses:  Bronchitis  Acute recurrent maxillary sinusitis      Sharyn CreamerMark Quale, MD 05/19/15 2022

## 2015-05-19 NOTE — ED Notes (Signed)
States chest pain for "a couple of days".  C/o center anterior chest pain.  Pain is intermittent.  Also has a two week history of dry cough and chronic sinus problems.  Called PCP, who referred her to ED for evaluation.  Patient states has history of PE in July 2007, postpartum.

## 2015-05-19 NOTE — Discharge Instructions (Signed)

## 2015-06-24 ENCOUNTER — Emergency Department
Admission: EM | Admit: 2015-06-24 | Discharge: 2015-06-24 | Disposition: A | Discharge status: Home / Self Care | Payer: Federal, State, Local not specified - PPO | Attending: Emergency Medicine | Admitting: Emergency Medicine

## 2015-06-24 ENCOUNTER — Encounter: Payer: Self-pay | Admitting: Emergency Medicine

## 2015-06-24 DIAGNOSIS — J020 Streptococcal pharyngitis: Secondary | ICD-10-CM | POA: Insufficient documentation

## 2015-06-24 DIAGNOSIS — H9209 Otalgia, unspecified ear: Secondary | ICD-10-CM | POA: Insufficient documentation

## 2015-06-24 DIAGNOSIS — R42 Dizziness and giddiness: Secondary | ICD-10-CM | POA: Diagnosis not present

## 2015-06-24 DIAGNOSIS — E119 Type 2 diabetes mellitus without complications: Secondary | ICD-10-CM | POA: Insufficient documentation

## 2015-06-24 DIAGNOSIS — R000 Tachycardia, unspecified: Secondary | ICD-10-CM | POA: Insufficient documentation

## 2015-06-24 DIAGNOSIS — J029 Acute pharyngitis, unspecified: Secondary | ICD-10-CM | POA: Diagnosis present

## 2015-06-24 LAB — URINALYSIS COMPLETE WITH MICROSCOPIC (ARMC ONLY)
BILIRUBIN URINE: NEGATIVE
GLUCOSE, UA: NEGATIVE mg/dL
LEUKOCYTES UA: NEGATIVE
Nitrite: NEGATIVE
Protein, ur: NEGATIVE mg/dL
Specific Gravity, Urine: 1.006 (ref 1.005–1.030)
pH: 6 (ref 5.0–8.0)

## 2015-06-24 LAB — RAPID INFLUENZA A&B ANTIGENS (ARMC ONLY)
INFLUENZA A (ARMC): NEGATIVE
INFLUENZA B (ARMC): NEGATIVE

## 2015-06-24 LAB — POCT RAPID STREP A: STREPTOCOCCUS, GROUP A SCREEN (DIRECT): POSITIVE — AB

## 2015-06-24 MED ORDER — LIDOCAINE VISCOUS 2 % MT SOLN
15.0000 mL | Freq: Once | OROMUCOSAL | Status: AC
  Administered 2015-06-24: 15 mL via OROMUCOSAL
  Filled 2015-06-24: qty 15

## 2015-06-24 MED ORDER — ACETAMINOPHEN 500 MG PO TABS
ORAL_TABLET | ORAL | Status: AC
  Administered 2015-06-24: 1000 mg via ORAL
  Filled 2015-06-24: qty 2

## 2015-06-24 MED ORDER — DEXAMETHASONE 1 MG/ML PO CONC
10.0000 mg | Freq: Once | ORAL | Status: AC
  Administered 2015-06-24: 10 mg via ORAL
  Filled 2015-06-24: qty 10

## 2015-06-24 MED ORDER — DEXAMETHASONE 1 MG/ML PO CONC
ORAL | Status: AC
  Filled 2015-06-24: qty 1

## 2015-06-24 MED ORDER — AMOXICILLIN 500 MG PO TABS
500.0000 mg | ORAL_TABLET | Freq: Two times a day (BID) | ORAL | Status: AC
Start: 2015-06-24 — End: 2015-07-04

## 2015-06-24 MED ORDER — SODIUM CHLORIDE 0.9 % IV BOLUS (SEPSIS)
1000.0000 mL | Freq: Once | INTRAVENOUS | Status: AC
Start: 2015-06-24 — End: 2015-06-24
  Administered 2015-06-24: 1000 mL via INTRAVENOUS

## 2015-06-24 MED ORDER — FLUCONAZOLE 100 MG PO TABS: 200.0000 mg | ORAL_TABLET | Freq: Once | ORAL | Status: AC

## 2015-06-24 MED ORDER — ACETAMINOPHEN 500 MG PO TABS
1000.0000 mg | ORAL_TABLET | Freq: Once | ORAL | Status: AC
  Administered 2015-06-24: 1000 mg via ORAL

## 2015-06-24 MED ORDER — AMOXICILLIN 500 MG PO CAPS
1000.0000 mg | ORAL_CAPSULE | Freq: Once | ORAL | Status: AC
  Administered 2015-06-24: 1000 mg via ORAL
  Filled 2015-06-24: qty 2

## 2015-06-24 NOTE — ED Notes (Signed)
Patient state that Friday morning she woke up with sore throat, body aches and chills.

## 2015-06-24 NOTE — ED Provider Notes (Signed)
[facility]  I accepted care from Dr. Zenda Alpers ____________________________________________    LABS (pertinent positives/negatives)  Urinalysis rare bacteria, 0-5 white blood cells, red blood cells, negative for leukocytes and nitrites. Trace ketones   ____________________________________________    RADIOLOGY All xrays were viewed by me. Imaging interpreted by radiologist.  None  ____________________________________________   PROCEDURES  Procedure(s) performed: None  Critical Care performed: None  ____________________________________________   INITIAL IMPRESSION / ASSESSMENT AND PLAN / ED COURSE   Pertinent labs & imaging results that were available during my care of the patient were reviewed by me and considered in my medical decision making (see chart for details).  Patient will be discharged with Dr. Leone Payor instructions and prescriptions for strep pharyngitis. Os is negative, patient stable for discharge and outpatient follow-up.   She states that she gets a yeast infection and he States antibiotics, so I did write her 1 time dose of Diflucan in case she develops symptoms.  CONSULTATIONS: None    Patient / Family / Caregiver informed of clinical course, medical decision-making process, and agree with plan.   I discussed return precautions, follow-up instructions, and discharged instructions with patient and/or family.     ____________________________________________   FINAL CLINICAL IMPRESSION(S) / ED DIAGNOSES  Final diagnoses:  Strep pharyngitis        Governor Rooks, MD 06/24/15 [phone]

## 2015-06-24 NOTE — Discharge Instructions (Signed)
Pharyngitis Pharyngitis is redness, pain, and swelling (inflammation) of your pharynx.  CAUSES  Pharyngitis is usually caused by infection. Most of the time, these infections are from viruses (viral) and are part of a cold. However, sometimes pharyngitis is caused by bacteria (bacterial). Pharyngitis can also be caused by allergies. Viral pharyngitis may be spread from person to person by coughing, sneezing, and personal items or utensils (cups, forks, spoons, toothbrushes). Bacterial pharyngitis may be spread from person to person by more intimate contact, such as kissing.  SIGNS AND SYMPTOMS  Symptoms of pharyngitis include:   Sore throat.   Tiredness (fatigue).   Low-grade fever.   Headache.  Joint pain and muscle aches.  Skin rashes.  Swollen lymph nodes.  Plaque-like film on throat or tonsils (often seen with bacterial pharyngitis). DIAGNOSIS  Your health care provider will ask you questions about your illness and your symptoms. Your medical history, along with a physical exam, is often all that is needed to diagnose pharyngitis. Sometimes, a rapid strep test is done. Other lab tests may also be done, depending on the suspected cause.  TREATMENT  Viral pharyngitis will usually get better in 3-4 days without the use of medicine. Bacterial pharyngitis is treated with medicines that kill germs (antibiotics).  HOME CARE INSTRUCTIONS   Drink enough water and fluids to keep your urine clear or pale yellow.   Only take over-the-counter or prescription medicines as directed by your health care provider:   If you are prescribed antibiotics, make sure you finish them even if you start to feel better.   Do not take aspirin.   Get lots of rest.   Gargle with 8 oz of salt water ( tsp of salt per 1 qt of water) as often as every 1-2 hours to soothe your throat.   Throat lozenges (if you are not at risk for choking) or sprays may be used to soothe your throat. SEEK MEDICAL  CARE IF:   You have large, tender lumps in your neck.  You have a rash.  You cough up green, yellow-brown, or bloody spit. SEEK IMMEDIATE MEDICAL CARE IF:   Your neck becomes stiff.  You drool or are unable to swallow liquids.  You vomit or are unable to keep medicines or liquids down.  You have severe pain that does not go away with the use of recommended medicines.  You have trouble breathing (not caused by a stuffy nose). MAKE SURE YOU:   Understand these instructions.  Will watch your condition.  Will get help right away if you are not doing well or get worse.   This information is not intended to replace advice given to you by your health care provider. Make sure you discuss any questions you have with your health care provider.   Document Released: 05/06/2005 Document Revised: 02/24/2013 Document Reviewed: 01/11/2013 Elsevier Interactive Patient Education 2016 [employer]  Strep Throat Strep throat is a bacterial infection of the throat. Your health care provider may call the infection tonsillitis or pharyngitis, depending on whether there is swelling in the tonsils or at the back of the throat. Strep throat is most common during the cold months of the year in children who are 10-105 years of age, but it can happen during any season in people of any age. This infection is spread from person to person (contagious) through coughing, sneezing, or close contact. CAUSES Strep throat is caused by the bacteria called Streptococcus pyogenes. RISK FACTORS This condition is more likely to  develop in: °· People who spend time in crowded places where the infection can spread easily. °· People who have close contact with someone who has strep throat. °SYMPTOMS °Symptoms of this condition include: °· Fever or chills.   °· Redness, swelling, or pain in the tonsils or throat. °· Pain or difficulty when swallowing. °· White or yellow spots on the tonsils or throat. °· Swollen, tender glands  in the neck or under the jaw. °· Red rash all over the body (rare). °DIAGNOSIS °This condition is diagnosed by performing a rapid strep test or by taking a swab of your throat (throat culture test). Results from a rapid strep test are usually ready in a few minutes, but throat culture test results are available after one or two days. °TREATMENT °This condition is treated with antibiotic medicine. °HOME CARE INSTRUCTIONS °Medicines °· Take over-the-counter and prescription medicines only as told by your health care provider. °· Take your antibiotic as told by your health care provider. Do not stop taking the antibiotic even if you start to feel better. °· Have family members who also have a sore throat or fever tested for strep throat. They may need antibiotics if they have the strep infection. °Eating and Drinking °· Do not share food, drinking cups, or personal items that could cause the infection to spread to other people. °· If swallowing is difficult, try eating soft foods until your sore throat feels better. °· Drink enough fluid to keep your urine clear or pale yellow. °General Instructions °· Gargle with a salt-water mixture 3-4 times per day or as needed. To make a salt-water mixture, completely dissolve ½-1 tsp of salt in 1 cup of warm water. °· Make sure that all household members wash their hands well. °· Get plenty of rest. °· Stay home from school or work until you have been taking antibiotics for 24 hours. °· Keep all follow-up visits as told by your health care provider. This is important. °SEEK MEDICAL CARE IF: °· The glands in your neck continue to get bigger. °· You develop a rash, cough, or earache. °· You cough up a thick liquid that is green, yellow-brown, or bloody. °· You have pain or discomfort that does not get better with medicine. °· Your problems seem to be getting worse rather than better. °· You have a fever. °SEEK IMMEDIATE MEDICAL CARE IF: °· You have new symptoms, such as vomiting,  severe headache, stiff or painful neck, chest pain, or shortness of breath. °· You have severe throat pain, drooling, or changes in your voice. °· You have swelling of the neck, or the skin on the neck becomes red and tender. °· You have signs of dehydration, such as fatigue, dry mouth, and decreased urination. °· You become increasingly sleepy, or you cannot wake up completely. °· Your joints become red or painful. °  °This information is not intended to replace advice given to you by your health care provider. Make sure you discuss any questions you have with your health care provider. °  °Document Released: 05/03/2000 Document Revised: 01/25/2015 Document Reviewed: 08/29/2014 °Elsevier Interactive Patient Education ©2016 [employer] ° °

## 2015-06-24 NOTE — ED Provider Notes (Signed)
[facility] Emergency Department Provider Note  ____________________________________________  Time seen: Approximately 636 AM  I have reviewed the triage vital signs and the nursing notes.   HISTORY  Chief Complaint Sore Throat; Generalized Body Aches; and Fever    HPI Wendy Gray is a 40 y.o. female who comes into the hospital today with body aches and sore throat. The patient reports that she has been hurting all over since yesterday she has pain in her throat since yesterday as well. The patient reports that she has been unable to drink much in the last 24 hours. The patient has a history of chronic sinusitis and has some ear pain. She reports that she was feeling rough and she couldn't tolerate the symptoms anymore so she decided to come in and get checked out. The patient reports that she took 600 mg of ibuprofen at home as well as some NyQuil but it has not been helping. The patient has had some fever but did not check her temperature. She just been having hot and cold flashes. The patient has some headache and facial pain with some mild lightheadedness. The patient denies any nausea or vomiting but does have some mild abdominal pain as well as back pain. The patient reports that the pain is 8-9 out of 10 in intensity. The patient denies any pain with urination. She reports that she just simply feels achy all over.   Past Medical History  Diagnosis Date  . Diabetes mellitus without complication (HCC)   . Pulmonary embolism (HCC)     There are no active problems to display for this patient.   Past Surgical History  Procedure Laterality Date  . C sections    . Abdominal hysterectomy      08/2012  . Nasal sinus surgery      Current Outpatient Rx  Name  Route  Sig  Dispense  Refill  . fluconazole (DIFLUCAN) 100 MG tablet   Oral   Take 1 tablet (100 mg total) by mouth daily. Take 1 tablet, then if persistent symptoms take a second tablet in 3  days   2 tablet   0     Allergies Review of patient's allergies indicates no known allergies.  No family history on file.  Social History Social History  Substance Use Topics  . Smoking status: Never Smoker   . Smokeless tobacco: Never Used  . Alcohol Use: No    Review of Systems Constitutional:  fever/chills Eyes: No visual changes. ENT:  sore throat and ear pain Cardiovascular: Denies chest pain. Respiratory: Denies shortness of breath. Gastrointestinal:  abdominal pain.  No nausea, no vomiting.  No diarrhea.  No constipation. Genitourinary: Negative for dysuria. Musculoskeletal: Generalized body aches Skin: Negative for rash. Neurological: Headache and weakness with some mild lightheadedness  10-point ROS otherwise negative.  ____________________________________________   PHYSICAL EXAM:  VITAL SIGNS: ED Triage Vitals  Enc Vitals Group     BP 06/24/15 0613 144/97 mmHg     Pulse Rate 06/24/15 0613 106     Resp 06/24/15 0613 20     Temp 06/24/15 0615 100.4 F (38 C)     Temp Source 06/24/15 0615 Oral     SpO2 06/24/15 0613 97 %     Weight 06/24/15 0613 260 lb (117.935 kg)     Height 06/24/15 0613  (1.6 m)     Head Cir --      Peak Flow --      Pain Score  06/24/15 0614 9     Pain Loc --      Pain Edu? --      Excl. in GC? --     Constitutional: Alert and oriented. Well appearing and in moderate distress. Eyes: Conjunctivae are normal. PERRL. EOMI. Head: Atraumatic. Nose: No congestion/rhinnorhea. Mouth/Throat: Mucous membranes are moist.  Oropharynx erythematous with some purulence on the left and left-sided cervical lymphadenopathy Cardiovascular: Tachycardia cardia regular rhythm. Grossly normal heart sounds.  Good peripheral circulation. Respiratory: Normal respiratory effort.  No retractions. Lungs CTAB. Gastrointestinal: Soft and nontender. No distention. Positive bowel sounds. Musculoskeletal: No lower extremity tenderness nor edema.    Neurologic:  Normal speech and language.  Skin:  Skin is warm, dry and intact.  Psychiatric: Mood and affect are normal.   ____________________________________________   LABS (all labs ordered are listed, but only abnormal results are displayed)  Labs Reviewed  POCT RAPID STREP A - Abnormal; Notable for the following:    Streptococcus, Group A Screen (Direct) POSITIVE (*)    All other components within normal limits  RAPID INFLUENZA A&B ANTIGENS ([facility] ONLY)  URINALYSIS COMPLETEWITH MICROSCOPIC ([facility] ONLY)   ____________________________________________  EKG  None ____________________________________________  RADIOLOGY  None ____________________________________________   PROCEDURES  Procedure(s) performed: None  Critical Care performed: No  ____________________________________________   INITIAL IMPRESSION / ASSESSMENT AND PLAN / ED COURSE  Pertinent labs & imaging results that were available during my care of the patient were reviewed by me and considered in my medical decision making (see chart for details).  This is a 40 year old female who comes in to the hospital today with some body aches and sore throat. The patient does have a mild temperature here in the emergency department and she does have positive strep on the rapid strep test. I will give the patient liter of normal saline as well as some Decadron for her sore throat and Tylenol for her fever. I will also treat the patient with antibiotics and check a flu swab and urinalysis. I will reassess the patient once I received the results of her blood work and determine the patient's disposition.  The patient did receive a dose of amoxicillin as well as a liter of normal saline. The patient's care was signed out to Dr. Shaune Pollack who will follow-up the results of her urinalysis and reassess her vital signs. At this time the patient has no further concerns and Dr. Shaune Pollack will disposition the  patient. ____________________________________________   FINAL CLINICAL IMPRESSION(S) / ED DIAGNOSES  Final diagnoses:  Strep pharyngitis      Rebecka Apley, MD 06/24/15 0800

## 2015-09-18 ENCOUNTER — Ambulatory Visit
Admission: RE | Admit: 2015-09-18 | Discharge: 2015-09-18 | Disposition: A | Discharge status: Home / Self Care | Payer: Federal, State, Local not specified - PPO | Source: Ambulatory Visit | Attending: Family Medicine | Admitting: Family Medicine

## 2015-09-18 ENCOUNTER — Other Ambulatory Visit: Payer: Self-pay | Admitting: Family Medicine

## 2015-09-18 DIAGNOSIS — R062 Wheezing: Secondary | ICD-10-CM

## 2015-09-18 DIAGNOSIS — J209 Acute bronchitis, unspecified: Secondary | ICD-10-CM | POA: Diagnosis present

## 2016-04-04 DIAGNOSIS — G4733 Obstructive sleep apnea (adult) (pediatric): Secondary | ICD-10-CM | POA: Insufficient documentation

## 2016-04-04 DIAGNOSIS — Z9989 Dependence on other enabling machines and devices: Secondary | ICD-10-CM | POA: Insufficient documentation

## 2017-10-09 DIAGNOSIS — E7800 Pure hypercholesterolemia, unspecified: Secondary | ICD-10-CM | POA: Insufficient documentation

## 2018-01-01 DIAGNOSIS — Z90710 Acquired absence of both cervix and uterus: Secondary | ICD-10-CM | POA: Insufficient documentation

## 2019-02-02 ENCOUNTER — Other Ambulatory Visit (HOSPITAL_COMMUNITY): Payer: Self-pay | Admitting: Family Medicine

## 2019-02-02 ENCOUNTER — Other Ambulatory Visit: Payer: Self-pay | Admitting: Family Medicine

## 2019-02-02 DIAGNOSIS — R229 Localized swelling, mass and lump, unspecified: Secondary | ICD-10-CM

## 2019-02-02 DIAGNOSIS — E119 Type 2 diabetes mellitus without complications: Secondary | ICD-10-CM

## 2019-02-09 ENCOUNTER — Ambulatory Visit
Admission: RE | Admit: 2019-02-09 | Discharge: 2019-02-09 | Disposition: A | Discharge status: Home / Self Care | Payer: Federal, State, Local not specified - PPO | Source: Ambulatory Visit | Attending: Family Medicine | Admitting: Family Medicine

## 2019-02-09 ENCOUNTER — Other Ambulatory Visit: Payer: Self-pay

## 2019-02-09 DIAGNOSIS — R229 Localized swelling, mass and lump, unspecified: Secondary | ICD-10-CM | POA: Diagnosis not present

## 2019-02-09 DIAGNOSIS — E119 Type 2 diabetes mellitus without complications: Secondary | ICD-10-CM | POA: Diagnosis present

## 2019-02-25 ENCOUNTER — Ambulatory Visit: Payer: Self-pay | Admitting: General Surgery

## 2019-03-18 ENCOUNTER — Other Ambulatory Visit: Payer: Self-pay

## 2019-03-18 ENCOUNTER — Ambulatory Visit (INDEPENDENT_AMBULATORY_CARE_PROVIDER_SITE_OTHER): Payer: Federal, State, Local not specified - PPO

## 2019-03-18 ENCOUNTER — Encounter: Payer: Self-pay | Admitting: General Surgery

## 2019-03-18 ENCOUNTER — Ambulatory Visit (INDEPENDENT_AMBULATORY_CARE_PROVIDER_SITE_OTHER): Payer: Federal, State, Local not specified - PPO | Admitting: General Surgery

## 2019-03-18 VITALS — BP 123/87 | HR 98 | Temp 97.2°F | Ht 63.0 in | Wt 259.0 lb

## 2019-03-18 DIAGNOSIS — E049 Nontoxic goiter, unspecified: Secondary | ICD-10-CM

## 2019-03-18 NOTE — Patient Instructions (Signed)
We will schedule you for removal of your nodule in office.

## 2019-03-18 NOTE — Progress Notes (Signed)
Patient ID: Wendy Gray, female   DOB: 1975-06-04, 43 y.o.   MRN: [id number]  Chief Complaint  Patient presents with  . New Patient (Initial Visit)    Thyroid Nodule behind L ear     HPI Wendy Gray is a 43 y.o. female.   She has been referred by her primary care provider, Dr. Rolm Gala, for further evaluation of a nodule behind her left ear.  It has been present for many years.  It is nontender.  It is mobile.  It does not change in size.  It has never become hot, red, or drained any substances.  Wendy Gray that she just brought it to her PCPs attention which resulted in further investigation.  An ultrasound was performed that suggests that the area is dystrophic calcification, potentially the sequela of old granulomatous disease.  In addition, Wendy Gray Gray that she has noticed her neck seems to be getting thicker.  She does have a history of prior benign thyroid biopsies.  She denies any heart palpitations or hand tremors.  No changes in the texture of her hair, skin, or fingernails.  She denies diarrhea or constipation.  No unexplained weight loss.  No heat or cold intolerance.  No family history of thyroid cancer.  No personal history of head or neck irradiation or occupational exposure to ionizing radiation.   Past Medical History:  Diagnosis Date  . Diabetes mellitus without complication (HCC)   . Pulmonary embolism [facility])     Past Surgical History:  Procedure Laterality Date  . ABDOMINAL HYSTERECTOMY     08/2012 Partial  . c sections    . NASAL SINUS SURGERY      Family History  Problem Relation Age of Onset  . Diabetes Mother   . Hypertension Mother   . Diabetes Father   . Hypertension Father     Social History Social History   Tobacco Use  . Smoking status: Never Smoker  . Smokeless tobacco: Never Used  Substance Use Topics  . Alcohol use: No  . Drug use: No    Allergies  Allergen Reactions  . Estrogens Other (See  Comments)    Hypercoagulable state    Current Outpatient Medications  Medication Sig Dispense Refill  . acyclovir (ZOVIRAX) 400 MG tablet Take by mouth.    Marland Kitchen azelastine (ASTELIN) 0.1 % nasal spray Place 1 spray into both nostrils 2 (two) times daily.  11  . Clobetasol Propionate 0.05 % shampoo Apply 1 application topically every 14 (fourteen) days. Allow to remain on dry scalp for 15 minutes then wash out followed by shampoo and conditioner.  3  . CORDRAN 0.05 % lotion Apply 1 application topically 2 (two) times daily as needed. For itching and scaling. Apply to face and scalp.  6  . Fluticasone Propionate 0.05 % LOTN APPLY TOPICALLY TO SCALP TWICE WEEKLY.    Marland Kitchen ketoconazole (NIZORAL) 2 % cream Apply topically.    . metFORMIN (GLUCOPHAGE) 500 MG tablet Take 500 mg by mouth 2 (two) times daily.  8  . montelukast (SINGULAIR) 10 MG tablet Take 10 mg by mouth every morning.  11  . phentermine (ADIPEX-P) 37.5 MG tablet TAKE 1 TABLET BY MOUTH 30 MINUTES BEFORE BREAKFAST DAILY    . sertraline (ZOLOFT) 100 MG tablet Take 100 mg by mouth daily as needed. For anxiety.    . valACYclovir (VALTREX) 500 MG tablet Take 500 mg by mouth daily as needed. For herpes.  2   No  current facility-administered medications for this visit.     Review of Systems Review of Systems  All other systems reviewed and are negative. Or as discussed above  Blood pressure 123/87, pulse 98, temperature (!) 97.2 F (36.2 C), temperature source Temporal, height 5\' 3"  (1.6 m), weight 259 lb (117.5 kg), SpO2 95 %. Body mass index is 45.88 kg/m.  Physical Exam Physical Exam Constitutional:      General: She is not in acute distress.    Appearance: Normal appearance. She is obese.  HENT:     Head: Normocephalic and atraumatic.     Nose:     Comments: Covered with a mask secondary to COVID-19 precautions    Mouth/Throat:     Comments: Covered with a mask secondary to COVID-19 precautions Eyes:     General: No scleral  icterus.       Right eye: No discharge.        Left eye: No discharge.     Comments: No proptosis or exophthalmos  Neck:     Comments: Examination is somewhat compromised by patient body habitus.  There is a palpable nodule in the isthmus of the thyroid.  I do not appreciate any other dominant thyroid masses.  The gland moves freely with deglutition.  Just posterior to her left earlobe, there is a well-circumscribed, roughly 1 cm subcutaneous nodule.  It does have some firmness to it but is freely mobile. Cardiovascular:     Rate and Rhythm: Normal rate and regular rhythm.     Pulses: Normal pulses.  Pulmonary:     Effort: Pulmonary effort is normal.     Breath sounds: Normal breath sounds.  Abdominal:     General: Bowel sounds are normal.     Palpations: Abdomen is soft.     Comments: Protuberant, consistent with her level of obesity.  Genitourinary:    Comments: Deferred Musculoskeletal:     Right lower leg: No edema.     Left lower leg: No edema.  Skin:    General: Skin is warm and dry.  Neurological:     General: No focal deficit present.     Mental Status: She is alert and oriented to person, place, and time.  Psychiatric:        Mood and Affect: Mood normal.        Behavior: Behavior normal.     Data Reviewed  No recent thyroid function tests have been performed.  I personally reviewed the imaging and concur with the radiologist findings below:   CLINICAL DATA:  43 year old female with a palpable subcutaneous nodule posterior to the left ear.  EXAM: ULTRASOUND OF HEAD/NECK SOFT TISSUES  TECHNIQUE: Ultrasound examination of the head and neck soft tissues was performed in the area of clinical concern.  COMPARISON:  None.  FINDINGS: Sonographic interrogation of the region of clinical concern demonstrates an 8 mm echogenic focus with dense posterior acoustic shadowing. The sonographic appearance is most consistent with a dystrophic calcification.   IMPRESSION: The palpable abnormality corresponds with an 8 mm dense dystrophic calcification. This may represent sequelae of old granulomatous disease.    Assessment This is a 43 year old woman who has had a small nodule behind her left ear for many years.  It does not bother her, but she is curious about what it might be.  She also has a multinodular goiter.  I performed an ultrasound in clinic today.  See separate documentation for full details, but in brief, she has multiple cystic nodules,  none of which have any concerning sonographic findings.  Plan We will arrange a procedure-only appointment for Wendy Gray to excise the small mass in clinic.  I discussed the risks of the procedure with her.  These include bleeding, infection, recurrence, pain, and scarring.  We should be able to perform this next week.    Duanne GuessJennifer Azaleah Usman 03/18/2019, 4:52 PM

## 2019-03-19 ENCOUNTER — Ambulatory Visit: Payer: Self-pay | Admitting: Surgery

## 2019-03-25 ENCOUNTER — Ambulatory Visit: Payer: Federal, State, Local not specified - PPO | Admitting: General Surgery

## 2019-04-20 ENCOUNTER — Ambulatory Visit: Payer: Federal, State, Local not specified - PPO | Admitting: General Surgery

## 2019-05-25 ENCOUNTER — Ambulatory Visit: Payer: Federal, State, Local not specified - PPO | Admitting: General Surgery

## 2019-05-31 ENCOUNTER — Encounter: Payer: Self-pay | Admitting: Emergency Medicine

## 2019-05-31 ENCOUNTER — Ambulatory Visit
Admission: EM | Admit: 2019-05-31 | Discharge: 2019-05-31 | Disposition: A | Discharge status: Home / Self Care | Payer: Federal, State, Local not specified - PPO | Attending: Emergency Medicine | Admitting: Emergency Medicine

## 2019-05-31 ENCOUNTER — Other Ambulatory Visit: Payer: Self-pay

## 2019-05-31 DIAGNOSIS — J0101 Acute recurrent maxillary sinusitis: Secondary | ICD-10-CM | POA: Diagnosis not present

## 2019-05-31 LAB — POC SARS CORONAVIRUS 2 AG -  ED: SARS Coronavirus 2 Ag: NEGATIVE

## 2019-05-31 MED ORDER — FLUCONAZOLE 150 MG PO TABS: 150.0000 mg | ORAL_TABLET | Freq: Every day | ORAL | 0 refills | Status: AC

## 2019-05-31 MED ORDER — AMOXICILLIN-POT CLAVULANATE 875-125 MG PO TABS: 1.0000 | ORAL_TABLET | Freq: Two times a day (BID) | ORAL | 0 refills | Status: AC

## 2019-05-31 NOTE — Discharge Instructions (Addendum)
Take the Augmentin and Diflucan as directed.    Follow-up with your primary care provider if your symptoms are not improving.

## 2019-05-31 NOTE — ED Triage Notes (Addendum)
Patient in today c/o sinus pressure/pain, congestion and post nasal drip x 2 weeks. Patient also c/o cough x 2 days and right ear pain x 1 day. Patient has taken OTC Mucinex DM, Ibuprofen and nasal sprays. Patient has a history of chronic sinusitis. Patient states she took husband's Fioricet on Saturday and Sunday.

## 2019-05-31 NOTE — ED Provider Notes (Signed)
Roderic Palau    CSN: [id number] Arrival date & time: 05/31/19  1148      History   Chief Complaint Chief Complaint  Patient presents with  . Sinus Problem    HPI Wendy Gray is a 44 y.o. female.   Patient presents with sinus pressure, shortness of breath due to nasal congestion, and postnasal drip x2 weeks.  She also reports a nonproductive cough x2 days and ear pain x1 day.  She has attempted treatment at home with ibuprofen, Mucinex, nasal spray.  Patient states she has a history of chronic sinusitis and was last treated in November 2020.  She denies fever, chills, sore throat, vomiting, diarrhea, or other symptoms.  The history is provided by the patient.    Past Medical History:  Diagnosis Date  . Diabetes mellitus without complication ([facility])   . Pulmonary embolism (HCC)     There are no problems to display for this patient.   Past Surgical History:  Procedure Laterality Date  . ABDOMINAL HYSTERECTOMY     08/2012 Partial  . c sections    . NASAL SINUS SURGERY      OB History   No obstetric history on file.      Home Medications    Prior to Admission medications   Medication Sig Start Date End Date Taking? Authorizing Provider  acyclovir (ZOVIRAX) 400 MG tablet Take by mouth. 06/15/18  Yes [provider]  azelastine (ASTELIN) 0.1 % nasal spray Place 1 spray into both nostrils 2 (two) times daily. 04/14/15  Yes [provider]  Clobetasol Propionate 0.05 % shampoo Apply 1 application topically every 14 (fourteen) days. Allow to remain on dry scalp for 15 minutes then wash out followed by shampoo and conditioner. 05/06/15  Yes [provider]  CORDRAN 0.05 % lotion Apply 1 application topically 2 (two) times daily as needed. For itching and scaling. Apply to face and scalp. 06/01/15  Yes [provider]  Fluticasone Propionate 0.05 % LOTN APPLY TOPICALLY TO SCALP TWICE WEEKLY. 02/05/19  Yes [provider]  ketoconazole (NIZORAL) 2 % cream Apply topically. 01/06/19  Yes [provider]  metFORMIN (GLUCOPHAGE) 500 MG tablet Take 500 mg by mouth 2 (two) times daily. 06/04/15  Yes [provider]  montelukast (SINGULAIR) 10 MG tablet Take 10 mg by mouth every morning. 06/01/15  Yes [provider]  phentermine (ADIPEX-P) 37.5 MG tablet TAKE 1 TABLET BY MOUTH 30 MINUTES BEFORE BREAKFAST DAILY 02/05/19  Yes [provider]  sertraline (ZOLOFT) 100 MG tablet Take 100 mg by mouth daily as needed. For anxiety. 06/16/15  Yes [provider]  valACYclovir (VALTREX) 500 MG tablet Take 500 mg by mouth daily as needed. For herpes. 05/11/15  Yes [provider]  amoxicillin-clavulanate (AUGMENTIN) 875-125 MG tablet Take 1 tablet by mouth every 12 (twelve) hours for 10 days. 05/31/19 06/10/19  Sharion Balloon, NP  fluconazole (DIFLUCAN) 150 MG tablet Take 1 tablet (150 mg total) by mouth daily. Take one tablet today.  May repeat in 3 days. 05/31/19   Sharion Balloon, NP    Family History Family History  Problem Relation Age of Onset  . Diabetes Mother   . Hypertension Mother   . Diabetes Father   . Hypertension Father     Social History Social History   Tobacco Use  . Smoking status: Never Smoker  . Smokeless tobacco: Never Used  Substance Use Topics  . Alcohol use: No  .  Drug use: No     Allergies   Estrogens   Review of Systems Review of Systems  Constitutional: Negative for chills and fever.  HENT: Positive for congestion, ear pain, postnasal drip and sinus pressure. Negative for sore throat.   Eyes: Negative for pain and visual disturbance.  Respiratory: Positive for cough and shortness of breath.   Cardiovascular: Negative for chest pain and palpitations.  Gastrointestinal: Negative for abdominal pain and vomiting.  Genitourinary: Negative for dysuria and hematuria.  Musculoskeletal: Negative for arthralgias and back pain.  Skin: Negative  for color change and rash.  Neurological: Negative for seizures and syncope.  All other systems reviewed and are negative.    Physical Exam Triage Vital Signs ED Triage Vitals  Enc Vitals Group     BP 05/31/19 1149 (!) 167/95     Pulse Rate 05/31/19 1149 96     Resp 05/31/19 1149 18     Temp 05/31/19 1149 98.3 F (36.8 C)     Temp Source 05/31/19 1149 Oral     SpO2 05/31/19 1149 96 %     Weight 05/31/19 1157 265 lb (120.2 kg)     Height 05/31/19 1157 5\' 3"  (1.6 m)     Head Circumference --      Peak Flow --      Pain Score 05/31/19 1157 7     Pain Loc --      Pain Edu? --      Excl. in GC? --    No data found.  Updated Vital Signs BP 132/86   Pulse 96   Temp 98.3 F (36.8 C) (Oral)   Resp 18   Ht 5\' 3"  (1.6 m)   Wt 265 lb (120.2 kg)   SpO2 96%   BMI 46.94 kg/m   Visual Acuity Right Eye Distance:   Left Eye Distance:   Bilateral Distance:    Right Eye Near:   Left Eye Near:    Bilateral Near:     Physical Exam Vitals and nursing note reviewed.  Constitutional:      General: She is not in acute distress.    Appearance: She is well-developed. She is not ill-appearing.  HENT:     Head: Normocephalic and atraumatic.     Right Ear: Tympanic membrane normal.     Left Ear: Tympanic membrane normal.     Nose: Congestion present.     Mouth/Throat:     Mouth: Mucous membranes are moist.     Pharynx: Oropharynx is clear.  Eyes:     Conjunctiva/sclera: Conjunctivae normal.  Cardiovascular:     Rate and Rhythm: Normal rate and regular rhythm.     Heart sounds: No murmur.  Pulmonary:     Effort: Pulmonary effort is normal. No respiratory distress.     Breath sounds: Normal breath sounds.  Abdominal:     General: Bowel sounds are normal.     Palpations: Abdomen is soft.     Tenderness: There is no abdominal tenderness. There is no guarding or rebound.  Musculoskeletal:     Cervical back: Neck supple.  Skin:    General: Skin is warm and dry.  Neurological:      General: No focal deficit present.     Mental Status: She is alert and oriented to person, place, and time.      UC Treatments / Results  Labs (all labs ordered are listed, but only abnormal results are displayed) Labs Reviewed  POC SARS CORONAVIRUS 2  AG -  ED    EKG   Radiology No results found.  Procedures Procedures (including critical care time)  Medications Ordered in UC Medications - No data to display  Initial Impression / Assessment and Plan / UC Course  I have reviewed the triage vital signs and the nursing notes.  Pertinent labs & imaging results that were available during my care of the patient were reviewed by me and considered in my medical decision making (see chart for details).   Acute recurrent maxillary sinusitis.  Treating with Augmentin.  Prescription also given for Diflucan as patient reports she usually has yeast infections following antibiotic use.  Instructed patient to follow-up with her PCP if her symptoms are not improving.  Patient agrees plan of care.     Final Clinical Impressions(s) / UC Diagnoses   Final diagnoses:  Acute recurrent maxillary sinusitis     Discharge Instructions     Take the Augmentin and Diflucan as directed.    Follow-up with your primary care provider if your symptoms are not improving.        ED Prescriptions    Medication Sig Dispense Auth. Provider   amoxicillin-clavulanate (AUGMENTIN) 875-125 MG tablet Take 1 tablet by mouth every 12 (twelve) hours for 10 days. 20 tablet Mickie Bail, NP   fluconazole (DIFLUCAN) 150 MG tablet Take 1 tablet (150 mg total) by mouth daily. Take one tablet today.  May repeat in 3 days. 2 tablet Mickie Bail, NP     PDMP not reviewed this encounter.   Mickie Bail, NP 05/31/19 1309

## 2019-07-02 ENCOUNTER — Encounter (INDEPENDENT_AMBULATORY_CARE_PROVIDER_SITE_OTHER): Payer: Self-pay | Admitting: Otolaryngology

## 2019-07-02 ENCOUNTER — Ambulatory Visit (INDEPENDENT_AMBULATORY_CARE_PROVIDER_SITE_OTHER): Payer: Federal, State, Local not specified - PPO | Admitting: Otolaryngology

## 2019-07-02 ENCOUNTER — Other Ambulatory Visit: Payer: Self-pay

## 2019-07-02 VITALS — Temp 97.2°F

## 2019-07-02 DIAGNOSIS — J310 Chronic rhinitis: Secondary | ICD-10-CM

## 2019-07-02 DIAGNOSIS — J324 Chronic pansinusitis: Secondary | ICD-10-CM

## 2019-07-02 NOTE — Progress Notes (Signed)
HPI: Wendy Gray is a 44 y.o. female who presents for evaluation of chronic sinus problems.  She has had a long history of recurrent sinus infections.  She also has history of migraine headaches as well as headaches from her sinuses.  She recently had a sinus infection back in November and then had another sinus infection recently and just completed a course of Augmentin.  She has had a long history of sinus problems and used to be followed at [facility].  She had surgery on her sinuses performed about 10 years ago.  She has had no recent scans of her sinuses.  Her smell and taste are off and on.  She frequently has pain and pressure around the right. She takes ibuprofen regularly as well as Mucinex D and Sudafed occasionally. She takes Singulair 10 mg regularly as well as saline rinses and lavages.  She uses nasal steroid spray most days.  Past Medical History:  Diagnosis Date  . Diabetes mellitus without complication (HCC)   . Pulmonary embolism [facility])    Past Surgical History:  Procedure Laterality Date  . ABDOMINAL HYSTERECTOMY     08/2012 Partial  . c sections    . NASAL SINUS SURGERY     Social History   Socioeconomic History  . Marital status: Married    Spouse name: Not on file  . Number of children: Not on file  . Years of education: Not on file  . Highest education level: Not on file  Occupational History  . Not on file  Tobacco Use  . Smoking status: Never Smoker  . Smokeless tobacco: Never Used  Substance and Sexual Activity  . Alcohol use: No  . Drug use: No  . Sexual activity: Not on file  Other Topics Concern  . Not on file  Social History Narrative  . Not on file   Social Determinants of Health   Financial Resource Strain:   . Difficulty of Paying Living Expenses: Not on file  Food Insecurity:   . Worried About Programme researcher, broadcasting/film/video in the Last Year: Not on file  . Ran Out of Food in the Last Year: Not on file  Transportation Needs:   . Lack of  Transportation (Medical): Not on file  . Lack of Transportation (Non-Medical): Not on file  Physical Activity:   . Days of Exercise per Week: Not on file  . Minutes of Exercise per Session: Not on file  Stress:   . Feeling of Stress : Not on file  Social Connections:   . Frequency of Communication with Friends and Family: Not on file  . Frequency of Social Gatherings with Friends and Family: Not on file  . Attends Religious Services: Not on file  . Active Member of Clubs or Organizations: Not on file  . Attends Banker Meetings: Not on file  . Marital Status: Not on file   Family History  Problem Relation Age of Onset  . Diabetes Mother   . Hypertension Mother   . Diabetes Father   . Hypertension Father    Allergies  Allergen Reactions  . Estrogens Other (See Comments)    Hypercoagulable state   Prior to Admission medications   Medication Sig Start Date End Date Taking? Authorizing Provider  acyclovir (ZOVIRAX) 400 MG tablet Take by mouth. 06/15/18  Yes [provider]  azelastine (ASTELIN) 0.1 % nasal spray Place 1 spray into both nostrils 2 (two) times daily. 04/14/15  Yes [provider]  Clobetasol Propionate 0.05 % shampoo Apply 1 application topically every 14 (fourteen) days. Allow to remain on dry scalp for 15 minutes then wash out followed by shampoo and conditioner. 05/06/15  Yes [provider]  CORDRAN 0.05 % lotion Apply 1 application topically 2 (two) times daily as needed. For itching and scaling. Apply to face and scalp. 06/01/15  Yes [provider]  fluconazole (DIFLUCAN) 150 MG tablet Take 1 tablet (150 mg total) by mouth daily. Take one tablet today.  May repeat in 3 days. 05/31/19  Yes Mickie Bail, NP  Fluticasone Propionate 0.05 % LOTN APPLY TOPICALLY TO SCALP TWICE WEEKLY. 02/05/19  Yes [provider]  ketoconazole (NIZORAL) 2 % cream Apply topically. 01/06/19  Yes [provider]  metFORMIN  (GLUCOPHAGE) 500 MG tablet Take 500 mg by mouth 2 (two) times daily. 06/04/15  Yes [provider]  montelukast (SINGULAIR) 10 MG tablet Take 10 mg by mouth every morning. 06/01/15  Yes [provider]  phentermine (ADIPEX-P) 37.5 MG tablet TAKE 1 TABLET BY MOUTH 30 MINUTES BEFORE BREAKFAST DAILY 02/05/19  Yes [provider]  sertraline (ZOLOFT) 100 MG tablet Take 100 mg by mouth daily as needed. For anxiety. 06/16/15  Yes [provider]  valACYclovir (VALTREX) 500 MG tablet Take 500 mg by mouth daily as needed. For herpes. 05/11/15  Yes [provider]     Positive ROS: Otherwise negative  All other systems have been reviewed and were otherwise negative with the exception of those mentioned in the HPI and as above.  Physical Exam: Constitutional: Alert, well-appearing, no acute distress Ears: External ears without lesions or tenderness. Ear canals are clear bilaterally with intact, clear TMs bilaterally. Nasal: External nose without lesions. Septum relatively midline.  Moderate rhinitis with moderate amount of mucus discharge within the nasal cavities. Nasal endoscopy was performed in the office today and this showed moderate mucosal swelling within the middle meatus bilaterally with mostly clear mucus discharge.  Posterior ethmoid regions were open with moderate mucosal swelling.  No polyps noted within the nasal cavity although she had some early polypoid changes within the ethmoid regions. Oral: Lips and gums without lesions. Tongue and palate mucosa without lesions. Posterior oropharynx clear. Neck: No palpable adenopathy or masses Respiratory: Breathing comfortably  Skin: No facial/neck lesions or rash noted.  Nasal/sinus endoscopy  Date/Time: 07/02/2019 5:17 PM Performed by: Drema Halon, MD Authorized by: Drema Halon, MD   Consent:    Consent obtained:  Verbal   Consent given by:  Patient Procedure details:     Indications: sino-nasal symptoms     Medication:  Afrin   Scope location: bilateral nare   Sinus:    Right middle meatus: normal     inflammation     Left middle meatus: normal     inflammation     Right nasopharynx: normal     Left nasopharynx: normal   Comments:     On nasal endoscopy patient had moderate inflammation and swelling within the middle meatus bilaterally with a moderate amount of mucus discharge.  No gross polyps noted within the nasal cavity.    Assessment: Chronic rhinitis. History of chronic sinusitis  Plan: At this point recommended regular use of nasal steroid spray and suggested trying Nasacort 2 sprays each nostril at night.  Saline nasal irrigation and/or lavage as needed. Gave her a prescription for Augmentin 875 mg twice daily for 2 weeks as well as Diflucan. If symptoms persist she will  call us to schedule CT scan of the sinuses for better evaluation of the extent of her sinus disease.  Radene Journey, MD

## 2019-08-05 ENCOUNTER — Encounter: Payer: Self-pay | Admitting: *Deleted

## 2019-08-11 ENCOUNTER — Other Ambulatory Visit: Payer: Self-pay

## 2019-08-11 ENCOUNTER — Emergency Department: Payer: Federal, State, Local not specified - PPO

## 2019-08-11 ENCOUNTER — Encounter: Payer: Self-pay | Admitting: Emergency Medicine

## 2019-08-11 ENCOUNTER — Emergency Department
Admission: EM | Admit: 2019-08-11 | Discharge: 2019-08-11 | Disposition: A | Discharge status: Home / Self Care | Payer: Federal, State, Local not specified - PPO | Attending: Emergency Medicine | Admitting: Emergency Medicine

## 2019-08-11 DIAGNOSIS — Z7984 Long term (current) use of oral hypoglycemic drugs: Secondary | ICD-10-CM | POA: Insufficient documentation

## 2019-08-11 DIAGNOSIS — J1282 Pneumonia due to coronavirus disease 2019: Secondary | ICD-10-CM

## 2019-08-11 DIAGNOSIS — I2699 Other pulmonary embolism without acute cor pulmonale: Secondary | ICD-10-CM | POA: Diagnosis not present

## 2019-08-11 DIAGNOSIS — J1289 Other viral pneumonia: Secondary | ICD-10-CM | POA: Insufficient documentation

## 2019-08-11 DIAGNOSIS — R0602 Shortness of breath: Secondary | ICD-10-CM | POA: Diagnosis present

## 2019-08-11 DIAGNOSIS — U071 COVID-19: Secondary | ICD-10-CM | POA: Insufficient documentation

## 2019-08-11 DIAGNOSIS — E119 Type 2 diabetes mellitus without complications: Secondary | ICD-10-CM | POA: Diagnosis not present

## 2019-08-11 LAB — BASIC METABOLIC PANEL
Anion gap: 12 (ref 5–15)
BUN: 11 mg/dL (ref 6–20)
CO2: 26 mmol/L (ref 22–32)
Calcium: 9.1 mg/dL (ref 8.9–10.3)
Chloride: 102 mmol/L (ref 98–111)
Creatinine, Ser: 0.55 mg/dL (ref 0.44–1.00)
GFR calc Af Amer: >60 mL/min (ref >60)
GFR calc non Af Amer: >60 mL/min (ref >60)
Glucose, Bld: 131 mg/dL — ABNORMAL HIGH (ref 70–99)
Potassium: 3.5 mmol/L (ref 3.5–5.1)
Sodium: 140 mmol/L (ref 135–145)

## 2019-08-11 LAB — CBC WITH DIFFERENTIAL/PLATELET
Abs Immature Granulocytes: 0.03 10*3/uL (ref 0.00–0.07)
Basophils Absolute: 0 10*3/uL (ref 0.0–0.1)
Basophils Relative: 0 %
Eosinophils Absolute: 0 10*3/uL (ref 0.0–0.5)
Eosinophils Relative: 0 %
HCT: 38.2 % (ref 36.0–46.0)
Hemoglobin: 13.1 g/dL (ref 12.0–15.0)
Immature Granulocytes: 1 %
Lymphocytes Relative: 29 %
Lymphs Abs: 1.8 10*3/uL (ref 0.7–4.0)
MCH: 29.6 pg (ref 26.0–34.0)
MCHC: 34.3 g/dL (ref 30.0–36.0)
MCV: 86.4 fL (ref 80.0–100.0)
Monocytes Absolute: 0.3 10*3/uL (ref 0.1–1.0)
Monocytes Relative: 6 %
Neutro Abs: 4 10*3/uL (ref 1.7–7.7)
Neutrophils Relative %: 64 %
Platelets: 349 10*3/uL (ref 150–400)
RBC: 4.42 MIL/uL (ref 3.87–5.11)
RDW: 12.5 % (ref 11.5–15.5)
Smear Review: NORMAL
WBC: 6.1 10*3/uL (ref 4.0–10.5)
nRBC: 0 % (ref 0.0–0.2)

## 2019-08-11 MED ORDER — HYDROCOD POLST-CPM POLST ER 10-8 MG/5ML PO SUER: 5.0000 mL | Freq: Two times a day (BID) | ORAL | 0 refills | Status: AC | PRN

## 2019-08-11 MED ORDER — PREDNISONE 50 MG PO TABS: 50.0000 mg | ORAL_TABLET | Freq: Every day | ORAL | 0 refills | Status: AC

## 2019-08-11 NOTE — ED Provider Notes (Signed)
[facility] Emergency Department Provider Note   ____________________________________________    I have reviewed the triage vital signs and the nursing notes.   HISTORY  Chief Complaint Shortness of Breath and Covid     HPI Wendy Gray is a 44 y.o. female with a history of diabetes who was diagnosed with novel coronavirus on 14 March.  She reports she has had mildly worsening shortness of breath over the last several days.  However she reports today is the first day that she has not had a fever.  Has been taking Tylenol and ibuprofen as well as Tussionex for her symptoms.  Her family has also had COVID-19 and are generally feeling better.  Denies chest pain.  No dizziness.  Does have some loose stools.  Past Medical History:  Diagnosis Date  . Diabetes mellitus without complication ([place])   . Pulmonary embolism (HCC)     There are no problems to display for this patient.   Past Surgical History:  Procedure Laterality Date  . ABDOMINAL HYSTERECTOMY     08/2012 Partial  . c sections    . NASAL SINUS SURGERY      Prior to Admission medications   Medication Sig Start Date End Date Taking? Authorizing Provider  acyclovir (ZOVIRAX) 400 MG tablet Take by mouth. 06/15/18   [provider]  azelastine (ASTELIN) 0.1 % nasal spray Place 1 spray into both nostrils 2 (two) times daily. 04/14/15   [provider]  chlorpheniramine-HYDROcodone (TUSSIONEX PENNKINETIC ER) 10-8 MG/5ML SUER Take 5 mLs by mouth every 12 (twelve) hours as needed for cough. 08/11/19   Lavonia Drafts, MD  Clobetasol Propionate 0.05 % shampoo Apply 1 application topically every 14 (fourteen) days. Allow to remain on dry scalp for 15 minutes then wash out followed by shampoo and conditioner. 05/06/15   [provider]  CORDRAN 0.05 % lotion Apply 1 application topically 2 (two) times daily as needed. For itching and scaling. Apply to face and scalp.  06/01/15   [provider]  fluconazole (DIFLUCAN) 150 MG tablet Take 1 tablet (150 mg total) by mouth daily. Take one tablet today.  May repeat in 3 days. 05/31/19   Sharion Balloon, NP  Fluticasone Propionate 0.05 % LOTN APPLY TOPICALLY TO SCALP TWICE WEEKLY. 02/05/19   [provider]  ketoconazole (NIZORAL) 2 % cream Apply topically. 01/06/19   [provider]  metFORMIN (GLUCOPHAGE) 500 MG tablet Take 500 mg by mouth 2 (two) times daily. 06/04/15   [provider]  montelukast (SINGULAIR) 10 MG tablet Take 10 mg by mouth every morning. 06/01/15   [provider]  phentermine (ADIPEX-P) 37.5 MG tablet TAKE 1 TABLET BY MOUTH 30 MINUTES BEFORE BREAKFAST DAILY 02/05/19   [provider]  predniSONE (DELTASONE) 50 MG tablet Take 1 tablet (50 mg total) by mouth daily with breakfast. 08/11/19   Lavonia Drafts, MD  sertraline (ZOLOFT) 100 MG tablet Take 100 mg by mouth daily as needed. For anxiety. 06/16/15   [provider]  valACYclovir (VALTREX) 500 MG tablet Take 500 mg by mouth daily as needed. For herpes. 05/11/15   [provider]     Allergies Estrogens  Family History  Problem Relation Age of Onset  . Diabetes Mother   . Hypertension Mother   . Diabetes Father   . Hypertension Father     Social History Social History   Tobacco Use  . Smoking status: Never Smoker  . Smokeless  tobacco: Never Used  Substance Use Topics  . Alcohol use: No  . Drug use: No    Review of Systems  Constitutional: No fever today Eyes: No visual changes.  ENT: No sore throat. Cardiovascular: Denies chest pain. Respiratory: As above Gastrointestinal: No abdominal pain.  Loose stools Genitourinary: Negative for dysuria. Musculoskeletal: Body aches Skin: Negative for rash. Neurological: Negative for  weakness   ____________________________________________   PHYSICAL EXAM:  VITAL SIGNS: ED Triage Vitals [08/11/19 1403]  Enc  Vitals Group     BP (!) 126/97     Pulse Rate 98     Resp 20     Temp 98.4 F (36.9 C)     Temp Source Oral     SpO2 94 %     Weight 120.7 kg (266 lb)     Height 1.6 m (5\' 3" )     Head Circumference      Peak Flow      Pain Score 0     Pain Loc      Pain Edu?      Excl. in GC?     Constitutional: Alert and oriented.   Nose: No congestion/rhinnorhea. Mouth/Throat: Mucous membranes are moist.   Neck:  Painless ROM Cardiovascular: Normal rate, regular rhythm.   Respiratory: Normal respiratory effort.  No retractions. Gastrointestinal:  No distention.    Musculoskeletal:  Warm and well perfused Neurologic:  Normal speech and language. No gross focal neurologic deficits are appreciated.  Skin:  Skin is warm, dry and intact. No rash noted. Psychiatric: Mood and affect are normal. Speech and behavior are normal.  ____________________________________________   LABS (all labs ordered are listed, but only abnormal results are displayed)  Labs Reviewed  BASIC METABOLIC PANEL - Abnormal; Notable for the following components:      Result Value   Glucose, Bld 131 (*)    All other components within normal limits  CBC WITH DIFFERENTIAL/PLATELET   ____________________________________________  EKG  ED ECG REPORT I, , the attending physician, personally viewed and interpreted this ECG.  Date: 08/11/2019  Rhythm: normal sinus rhythm QRS Axis: Left axis deviation Intervals: normal ST/T Wave abnormalities: normal Narrative Interpretation: no evidence of acute ischemia  ____________________________________________  RADIOLOGY  Chest x-ray demonstrates bilateral densities suspicious for pneumonia, viewed by me ____________________________________________   PROCEDURES  Procedure(s) performed: No  Procedures   Critical Care performed: No ____________________________________________   INITIAL IMPRESSION / ASSESSMENT AND PLAN / ED COURSE  Pertinent labs  & imaging results that were available during my care of the patient were reviewed by me and considered in my medical decision making (see chart for details).  Patient presents with known COVID-19, chest x-ray consistent with pneumonia, oxygen saturations are staying above 93%.  All other vitals normal.  Lab work is unremarkable.  Recommend continued supportive care, will Rx prednisone, Tussionex, return precautions discussed    ____________________________________________   FINAL CLINICAL IMPRESSION(S) / ED DIAGNOSES  Final diagnoses:  Pneumonia due to COVID-19 virus        Note:  This document was prepared using Dragon voice recognition software and may include unintentional dictation errors.   08/13/2019, MD 08/11/19 [phone]

## 2019-08-11 NOTE — ED Triage Notes (Signed)
Pt in via POV, reports testing Covid+ on 3/14, continues with ongoing shortness of breath, worse on exertion.  Vitals WDL, NAD noted at this time.

## 2019-12-31 ENCOUNTER — Other Ambulatory Visit (INDEPENDENT_AMBULATORY_CARE_PROVIDER_SITE_OTHER): Payer: Self-pay | Admitting: Otolaryngology

## 2020-05-02 ENCOUNTER — Other Ambulatory Visit: Payer: Self-pay | Admitting: Medical Oncology

## 2020-05-02 ENCOUNTER — Other Ambulatory Visit (HOSPITAL_COMMUNITY): Payer: Self-pay | Admitting: Medical Oncology

## 2020-05-02 DIAGNOSIS — R310 Gross hematuria: Secondary | ICD-10-CM

## 2020-05-03 ENCOUNTER — Other Ambulatory Visit: Payer: Self-pay

## 2020-05-03 ENCOUNTER — Ambulatory Visit
Admission: RE | Admit: 2020-05-03 | Discharge: 2020-05-03 | Disposition: A | Discharge status: Home / Self Care | Payer: Federal, State, Local not specified - PPO | Source: Ambulatory Visit | Attending: Medical Oncology | Admitting: Medical Oncology

## 2020-05-03 ENCOUNTER — Encounter: Payer: Self-pay | Admitting: *Deleted

## 2020-05-03 DIAGNOSIS — R310 Gross hematuria: Secondary | ICD-10-CM | POA: Diagnosis not present

## 2020-06-07 ENCOUNTER — Other Ambulatory Visit: Payer: Federal, State, Local not specified - PPO

## 2020-06-07 DIAGNOSIS — Z20822 Contact with and (suspected) exposure to covid-19: Secondary | ICD-10-CM

## 2020-06-09 LAB — SARS-COV-2, NAA 2 DAY TAT

## 2020-06-09 LAB — NOVEL CORONAVIRUS, NAA: SARS-CoV-2, NAA: DETECTED — AB

## 2020-07-06 ENCOUNTER — Other Ambulatory Visit: Payer: Self-pay

## 2020-07-06 ENCOUNTER — Ambulatory Visit: Payer: Federal, State, Local not specified - PPO | Admitting: Podiatry

## 2020-07-06 ENCOUNTER — Encounter: Payer: Self-pay | Admitting: Podiatry

## 2020-07-06 VITALS — BP 142/88 | HR 70

## 2020-07-06 DIAGNOSIS — L600 Ingrowing nail: Secondary | ICD-10-CM

## 2020-07-06 DIAGNOSIS — N200 Calculus of kidney: Secondary | ICD-10-CM | POA: Insufficient documentation

## 2020-07-07 ENCOUNTER — Encounter: Payer: Self-pay | Admitting: Podiatry

## 2020-07-07 NOTE — Progress Notes (Signed)
Subjective:  Patient ID: Wendy Gray, female    DOB: [birth year],  MRN: [id number]  Chief Complaint  Patient presents with  . Ingrown Toenail    Right great toe is swollen, bleeding and painful since 1 week,soaking in epsom salts is not helping    45 y.o. female presents with the above complaint.  Patient presents with right hallux medial border ingrown.  There is been very painful to touch.  She is tried soaking in Epsom salt has been going on for 1 week.  Nothing is helping.  She would like to have it removed.  She denies any other acute complaints.  Is she denies seeing anyone else prior to seeing me.   Review of Systems: Negative except as noted in the HPI. Denies N/V/F/Ch.  Past Medical History:  Diagnosis Date  . Diabetes mellitus without complication (HCC)   . Pulmonary embolism (HCC)     Current Outpatient Medications:  .  acyclovir (ZOVIRAX) 400 MG tablet, Take by mouth., Disp: , Rfl:  .  albuterol (VENTOLIN HFA) 108 (90 Base) MCG/ACT inhaler, INHALE 2 INHALATIONS INTO THE LUNGS EVERY 4 (FOUR) HOURS AS NEEDED FOR WHEEZING, Disp: , Rfl:  .  azelastine (ASTELIN) 0.1 % nasal spray, Place 1 spray into both nostrils 2 (two) times daily., Disp: , Rfl: 11 .  bismuth subsalicylate (PEPTO BISMOL) 262 MG/15ML suspension, Take by mouth., Disp: , Rfl:  .  calcium carbonate (OS-CAL) 1250 (500 Ca) MG chewable tablet, Chew by mouth., Disp: , Rfl:  .  chlorpheniramine-HYDROcodone (TUSSIONEX PENNKINETIC ER) 10-8 MG/5ML SUER, Take 5 mLs by mouth every 12 (twelve) hours as needed for cough., Disp: 140 mL, Rfl: 0 .  Clobetasol Propionate 0.05 % shampoo, Apply 1 application topically every 14 (fourteen) days. Allow to remain on dry scalp for 15 minutes then wash out followed by shampoo and conditioner., Disp: , Rfl: 3 .  CORDRAN 0.05 % lotion, Apply 1 application topically 2 (two) times daily as needed. For itching and scaling. Apply to face and scalp., Disp: , Rfl: 6 .  dexamethasone  (DECADRON) 1 MG tablet, Take 1 mg by mouth at bedtime., Disp: , Rfl:  .  enoxaparin (LOVENOX) 40 MG/0.4ML injection, Inject into the skin., Disp: , Rfl:  .  fluconazole (DIFLUCAN) 150 MG tablet, Take 1 tablet (150 mg total) by mouth daily. Take one tablet today.  May repeat in 3 days., Disp: 2 tablet, Rfl: 0 .  Fluticasone Propionate 0.05 % LOTN, APPLY TOPICALLY TO SCALP TWICE WEEKLY., Disp: , Rfl:  .  ketoconazole (NIZORAL) 2 % cream, Apply topically., Disp: , Rfl:  .  metFORMIN (GLUCOPHAGE) 500 MG tablet, Take 500 mg by mouth 2 (two) times daily., Disp: , Rfl: 8 .  metoCLOPramide (REGLAN) 10 MG tablet, Take one tab the morning of surgery., Disp: , Rfl:  .  mometasone (NASONEX) 50 MCG/ACT nasal spray, TWO SPRAYS INTO EACH NOSTRIL EVERY NIGHT, Disp: 17 g, Rfl: 1 .  montelukast (SINGULAIR) 10 MG tablet, Take 10 mg by mouth every morning., Disp: , Rfl: 11 .  omeprazole (PRILOSEC) 40 MG capsule, Take by mouth., Disp: , Rfl:  .  ondansetron (ZOFRAN-ODT) 8 MG disintegrating tablet, Let one tablet melt on your tongue every 6 hours as needed for nausea, Disp: , Rfl:  .  phentermine (ADIPEX-P) 37.5 MG tablet, TAKE 1 TABLET BY MOUTH 30 MINUTES BEFORE BREAKFAST DAILY, Disp: , Rfl:  .  predniSONE (DELTASONE) 50 MG tablet, Take 1 tablet (50 mg total) by mouth daily  with breakfast., Disp: 5 tablet, Rfl: 0 .  sertraline (ZOLOFT) 100 MG tablet, Take 100 mg by mouth daily as needed. For anxiety., Disp: , Rfl:  .  tamsulosin (FLOMAX) 0.4 MG CAPS capsule, Take 0.4 mg by mouth daily., Disp: , Rfl:  .  ursodiol (ACTIGALL) 300 MG capsule, Take one tablet twice a day with food beginning 14 days after surgery., Disp: , Rfl:  .  valACYclovir (VALTREX) 500 MG tablet, Take 500 mg by mouth daily as needed. For herpes., Disp: , Rfl: 2  Social History   Tobacco Use  Smoking Status Never Smoker  Smokeless Tobacco Never Used    Allergies  Allergen Reactions  . Estrogens Other (See Comments)    Hypercoagulable state    Objective:   Vitals:   07/06/20 1635  BP: (!) 142/88  Pulse: 70   There is no height or weight on file to calculate BMI. Constitutional Well developed. Well nourished.  Vascular Dorsalis pedis pulses palpable bilaterally. Posterior tibial pulses palpable bilaterally. Capillary refill normal to all digits.  No cyanosis or clubbing noted. Pedal hair growth normal.  Neurologic Normal speech. Oriented to person, place, and time. Epicritic sensation to light touch grossly present bilaterally.  Dermatologic Painful ingrowing nail at medial nail borders of the hallux nail right. No other open wounds. No skin lesions.  Orthopedic: Normal joint ROM without pain or crepitus bilaterally. No visible deformities. No bony tenderness.   Radiographs: None Assessment:   1. Ingrown toenail of right foot    Plan:  Patient was evaluated and treated and all questions answered.  Ingrown Nail, right -Patient elects to proceed with minor surgery to remove ingrown toenail removal today. Consent reviewed and signed by patient. -Ingrown nail excised. See procedure note. -Educated on post-procedure care including soaking. Written instructions provided and reviewed. -Patient to follow up in 2 weeks for nail check.  Procedure: Excision of Ingrown Toenail Location: Right 1st toe medial nail borders. Anesthesia: Lidocaine 1% plain; 1.5 mL and Marcaine 0.5% plain; 1.5 mL, digital block. Skin Prep: Betadine. Dressing: Silvadene; telfa; dry, sterile, compression dressing. Technique: Following skin prep, the toe was exsanguinated and a tourniquet was secured at the base of the toe. The affected nail border was freed, split with a nail splitter, and excised. Chemical matrixectomy was then performed with phenol and irrigated out with alcohol. The tourniquet was then removed and sterile dressing applied. Disposition: Patient tolerated procedure well. Patient to return in 2 weeks for follow-up.   No  follow-ups on file.

## 2020-10-24 ENCOUNTER — Ambulatory Visit (INDEPENDENT_AMBULATORY_CARE_PROVIDER_SITE_OTHER): Payer: Federal, State, Local not specified - PPO | Admitting: Podiatry

## 2020-10-24 ENCOUNTER — Other Ambulatory Visit: Payer: Self-pay

## 2020-10-24 ENCOUNTER — Encounter: Payer: Self-pay | Admitting: Podiatry

## 2020-10-24 DIAGNOSIS — L600 Ingrowing nail: Secondary | ICD-10-CM

## 2020-10-24 NOTE — Progress Notes (Signed)
Subjective:  Patient ID: Wendy Gray, female    DOB: 03-Jan-1976,  MRN: [id number]  Chief Complaint  Patient presents with  . Nail Problem    Ingrown     45 y.o. female presents with the above complaint.  Patient further presents with follow-up to right hallux medial border recurrence.  Patient states painful to touch.  She would like to have it removed again.  She states that she gave her a few months of relief however started grow back again is causing her pain.  She denies any other acute complaints.   Review of Systems: Negative except as noted in the HPI. Denies N/V/F/Ch.  Past Medical History:  Diagnosis Date  . Diabetes mellitus without complication (HCC)   . Pulmonary embolism (HCC)     Current Outpatient Medications:  .  acyclovir (ZOVIRAX) 400 MG tablet, Take by mouth., Disp: , Rfl:  .  albuterol (VENTOLIN HFA) 108 (90 Base) MCG/ACT inhaler, INHALE 2 INHALATIONS INTO THE LUNGS EVERY 4 (FOUR) HOURS AS NEEDED FOR WHEEZING, Disp: , Rfl:  .  azelastine (ASTELIN) 0.1 % nasal spray, Place 1 spray into both nostrils 2 (two) times daily., Disp: , Rfl: 11 .  bismuth subsalicylate (PEPTO BISMOL) 262 MG/15ML suspension, Take by mouth., Disp: , Rfl:  .  calcium carbonate (OS-CAL) 1250 (500 Ca) MG chewable tablet, Chew by mouth., Disp: , Rfl:  .  chlorpheniramine-HYDROcodone (TUSSIONEX PENNKINETIC ER) 10-8 MG/5ML SUER, Take 5 mLs by mouth every 12 (twelve) hours as needed for cough., Disp: 140 mL, Rfl: 0 .  Clobetasol Propionate 0.05 % shampoo, Apply 1 application topically every 14 (fourteen) days. Allow to remain on dry scalp for 15 minutes then wash out followed by shampoo and conditioner., Disp: , Rfl: 3 .  CORDRAN 0.05 % lotion, Apply 1 application topically 2 (two) times daily as needed. For itching and scaling. Apply to face and scalp., Disp: , Rfl: 6 .  dexamethasone (DECADRON) 1 MG tablet, Take 1 mg by mouth at bedtime., Disp: , Rfl:  .  enoxaparin (LOVENOX) 40 MG/0.4ML  injection, Inject into the skin., Disp: , Rfl:  .  fluconazole (DIFLUCAN) 150 MG tablet, Take 1 tablet (150 mg total) by mouth daily. Take one tablet today.  May repeat in 3 days., Disp: 2 tablet, Rfl: 0 .  Fluticasone Propionate 0.05 % LOTN, APPLY TOPICALLY TO SCALP TWICE WEEKLY., Disp: , Rfl:  .  ketoconazole (NIZORAL) 2 % cream, Apply topically., Disp: , Rfl:  .  metFORMIN (GLUCOPHAGE) 500 MG tablet, Take 500 mg by mouth 2 (two) times daily., Disp: , Rfl: 8 .  metoCLOPramide (REGLAN) 10 MG tablet, Take one tab the morning of surgery., Disp: , Rfl:  .  mometasone (NASONEX) 50 MCG/ACT nasal spray, TWO SPRAYS INTO EACH NOSTRIL EVERY NIGHT, Disp: 17 g, Rfl: 1 .  montelukast (SINGULAIR) 10 MG tablet, Take 10 mg by mouth every morning., Disp: , Rfl: 11 .  omeprazole (PRILOSEC) 40 MG capsule, Take by mouth., Disp: , Rfl:  .  ondansetron (ZOFRAN-ODT) 8 MG disintegrating tablet, Let one tablet melt on your tongue every 6 hours as needed for nausea, Disp: , Rfl:  .  phentermine (ADIPEX-P) 37.5 MG tablet, TAKE 1 TABLET BY MOUTH 30 MINUTES BEFORE BREAKFAST DAILY, Disp: , Rfl:  .  predniSONE (DELTASONE) 50 MG tablet, Take 1 tablet (50 mg total) by mouth daily with breakfast., Disp: 5 tablet, Rfl: 0 .  sertraline (ZOLOFT) 100 MG tablet, Take 100 mg by mouth daily as needed.  For anxiety., Disp: , Rfl:  .  tamsulosin (FLOMAX) 0.4 MG CAPS capsule, Take 0.4 mg by mouth daily., Disp: , Rfl:  .  ursodiol (ACTIGALL) 300 MG capsule, Take one tablet twice a day with food beginning 14 days after surgery., Disp: , Rfl:  .  valACYclovir (VALTREX) 500 MG tablet, Take 500 mg by mouth daily as needed. For herpes., Disp: , Rfl: 2  Social History   Tobacco Use  Smoking Status Never Smoker  Smokeless Tobacco Never Used    Allergies  Allergen Reactions  . Estrogens Other (See Comments)    Hypercoagulable state   Objective:   There were no vitals filed for this visit. There is no height or weight on file to  calculate BMI. Constitutional Well developed. Well nourished.  Vascular Dorsalis pedis pulses palpable bilaterally. Posterior tibial pulses palpable bilaterally. Capillary refill normal to all digits.  No cyanosis or clubbing noted. Pedal hair growth normal.  Neurologic Normal speech. Oriented to person, place, and time. Epicritic sensation to light touch grossly present bilaterally.  Dermatologic Painful ingrowing nail at medial nail borders of the hallux nail right. No other open wounds. No skin lesions.  Orthopedic: Normal joint ROM without pain or crepitus bilaterally. No visible deformities. No bony tenderness.   Radiographs: None Assessment:   1. Ingrown toenail of right foot    Plan:  Patient was evaluated and treated and all questions answered.  Ingrown Nail, right~recurrence -Patient elects to proceed with minor surgery to remove ingrown toenail removal today. Consent reviewed and signed by patient. -Ingrown nail excised. See procedure note. -Educated on post-procedure care including soaking. Written instructions provided and reviewed. -Patient to follow up in 2 weeks for nail check.  Procedure: Excision of Ingrown Toenail Location: Right 1st toe medial nail borders. Anesthesia: Lidocaine 1% plain; 1.5 mL and Marcaine 0.5% plain; 1.5 mL, digital block. Skin Prep: Betadine. Dressing: Silvadene; telfa; dry, sterile, compression dressing. Technique: Following skin prep, the toe was exsanguinated and a tourniquet was secured at the base of the toe. The affected nail border was freed, split with a nail splitter, and excised. Chemical matrixectomy was then performed with phenol and irrigated out with alcohol. The tourniquet was then removed and sterile dressing applied. Disposition: Patient tolerated procedure well. Patient to return in 2 weeks for follow-up.   No follow-ups on file.

## 2021-01-29 ENCOUNTER — Other Ambulatory Visit (INDEPENDENT_AMBULATORY_CARE_PROVIDER_SITE_OTHER): Payer: Self-pay | Admitting: Otolaryngology

## 2021-02-16 ENCOUNTER — Encounter: Payer: Self-pay | Admitting: General Surgery

## 2023-05-27 DIAGNOSIS — D0512 Intraductal carcinoma in situ of left breast: Secondary | ICD-10-CM | POA: Diagnosis not present

## 2023-05-28 DIAGNOSIS — D0512 Intraductal carcinoma in situ of left breast: Secondary | ICD-10-CM | POA: Diagnosis not present

## 2023-05-29 DIAGNOSIS — D0512 Intraductal carcinoma in situ of left breast: Secondary | ICD-10-CM | POA: Diagnosis not present

## 2023-05-30 DIAGNOSIS — D0512 Intraductal carcinoma in situ of left breast: Secondary | ICD-10-CM | POA: Diagnosis not present

## 2023-06-02 DIAGNOSIS — D0512 Intraductal carcinoma in situ of left breast: Secondary | ICD-10-CM | POA: Diagnosis not present

## 2023-06-03 DIAGNOSIS — D0512 Intraductal carcinoma in situ of left breast: Secondary | ICD-10-CM | POA: Diagnosis not present

## 2023-06-04 DIAGNOSIS — D0512 Intraductal carcinoma in situ of left breast: Secondary | ICD-10-CM | POA: Diagnosis not present

## 2023-06-05 DIAGNOSIS — D0512 Intraductal carcinoma in situ of left breast: Secondary | ICD-10-CM | POA: Diagnosis not present

## 2023-06-06 DIAGNOSIS — D0512 Intraductal carcinoma in situ of left breast: Secondary | ICD-10-CM | POA: Diagnosis not present

## 2023-06-10 DIAGNOSIS — D0512 Intraductal carcinoma in situ of left breast: Secondary | ICD-10-CM | POA: Diagnosis not present

## 2023-06-12 DIAGNOSIS — D0512 Intraductal carcinoma in situ of left breast: Secondary | ICD-10-CM | POA: Diagnosis not present

## 2023-06-13 DIAGNOSIS — Z86711 Personal history of pulmonary embolism: Secondary | ICD-10-CM | POA: Diagnosis not present

## 2023-06-13 DIAGNOSIS — Z853 Personal history of malignant neoplasm of breast: Secondary | ICD-10-CM | POA: Diagnosis not present

## 2023-06-13 DIAGNOSIS — R0789 Other chest pain: Secondary | ICD-10-CM | POA: Diagnosis not present

## 2023-06-13 DIAGNOSIS — M546 Pain in thoracic spine: Secondary | ICD-10-CM | POA: Diagnosis not present

## 2023-06-13 DIAGNOSIS — I517 Cardiomegaly: Secondary | ICD-10-CM | POA: Diagnosis not present

## 2023-06-13 DIAGNOSIS — J90 Pleural effusion, not elsewhere classified: Secondary | ICD-10-CM | POA: Diagnosis not present

## 2023-06-13 DIAGNOSIS — Z923 Personal history of irradiation: Secondary | ICD-10-CM | POA: Diagnosis not present

## 2023-06-13 DIAGNOSIS — R9431 Abnormal electrocardiogram [ECG] [EKG]: Secondary | ICD-10-CM | POA: Diagnosis not present

## 2023-06-16 DIAGNOSIS — D0512 Intraductal carcinoma in situ of left breast: Secondary | ICD-10-CM | POA: Diagnosis not present

## 2023-06-17 DIAGNOSIS — D0512 Intraductal carcinoma in situ of left breast: Secondary | ICD-10-CM | POA: Diagnosis not present

## 2023-06-18 DIAGNOSIS — D0512 Intraductal carcinoma in situ of left breast: Secondary | ICD-10-CM | POA: Diagnosis not present

## 2023-06-19 DIAGNOSIS — D0512 Intraductal carcinoma in situ of left breast: Secondary | ICD-10-CM | POA: Diagnosis not present

## 2023-06-20 DIAGNOSIS — D0512 Intraductal carcinoma in situ of left breast: Secondary | ICD-10-CM | POA: Diagnosis not present

## 2023-06-23 DIAGNOSIS — D0512 Intraductal carcinoma in situ of left breast: Secondary | ICD-10-CM | POA: Diagnosis not present

## 2023-06-24 DIAGNOSIS — D0512 Intraductal carcinoma in situ of left breast: Secondary | ICD-10-CM | POA: Diagnosis not present

## 2023-06-25 DIAGNOSIS — D0512 Intraductal carcinoma in situ of left breast: Secondary | ICD-10-CM | POA: Diagnosis not present

## 2023-06-26 DIAGNOSIS — D0512 Intraductal carcinoma in situ of left breast: Secondary | ICD-10-CM | POA: Diagnosis not present

## 2023-06-27 DIAGNOSIS — D0512 Intraductal carcinoma in situ of left breast: Secondary | ICD-10-CM | POA: Diagnosis not present

## 2023-07-10 DIAGNOSIS — Z1331 Encounter for screening for depression: Secondary | ICD-10-CM | POA: Diagnosis not present

## 2023-07-10 DIAGNOSIS — B9689 Other specified bacterial agents as the cause of diseases classified elsewhere: Secondary | ICD-10-CM | POA: Diagnosis not present

## 2023-07-10 DIAGNOSIS — R051 Acute cough: Secondary | ICD-10-CM | POA: Diagnosis not present

## 2023-07-10 DIAGNOSIS — Z1211 Encounter for screening for malignant neoplasm of colon: Secondary | ICD-10-CM | POA: Diagnosis not present

## 2023-07-10 DIAGNOSIS — J019 Acute sinusitis, unspecified: Secondary | ICD-10-CM | POA: Diagnosis not present

## 2023-10-14 ENCOUNTER — Ambulatory Visit: Admitting: Urology

## 2023-11-17 ENCOUNTER — Ambulatory Visit (INDEPENDENT_AMBULATORY_CARE_PROVIDER_SITE_OTHER): Payer: Self-pay | Admitting: Urology

## 2023-11-17 VITALS — BP 132/84 | HR 84 | Ht 63.0 in | Wt 230.0 lb

## 2023-11-17 DIAGNOSIS — N200 Calculus of kidney: Secondary | ICD-10-CM

## 2023-11-17 DIAGNOSIS — R310 Gross hematuria: Secondary | ICD-10-CM

## 2023-11-17 LAB — MICROSCOPIC EXAMINATION

## 2023-11-17 LAB — URINALYSIS, COMPLETE
Bilirubin, UA: NEGATIVE
Glucose, UA: NEGATIVE
Ketones, UA: NEGATIVE
Leukocytes,UA: NEGATIVE
Nitrite, UA: NEGATIVE
Protein,UA: NEGATIVE
RBC, UA: NEGATIVE
Specific Gravity, UA: 1.02 (ref 1.005–1.030)
Urobilinogen, Ur: 0.2 mg/dL (ref 0.2–1.0)
pH, UA: 7 (ref 5.0–7.5)

## 2023-11-17 NOTE — Patient Instructions (Signed)
 Scheduling number: [phone]

## 2023-11-17 NOTE — Progress Notes (Signed)
   11/17/23 12:25 PM   Wendy Gray 1975-10-07 [id number]  CC: Nephrolithiasis, gross hematuria  HPI: 48 year old female who was previously followed by [facility] urology, referred today for gross hematuria that happens once every 3 to 4 weeks.  She has some chronic bilateral back pain.  She previously was diagnosed with a 1.5 cm left renal stone, but apparently this could not be identified endoscopically during ureteroscopy at [facility] in 2022.  She also had a ureteral injury from placement of a ureteral access sheath at that time, and underwent stent placement for 6 weeks after.  Most recent abdominal cross-sectional imaging from June 2022 showed a large renal calcification near the left renal pelvis, appearing to be contained within a parapelvic cyst separate from the collecting system on delayed images.  She was diagnosed with breast cancer and treated with surgery and radiation.  She is interested in further evaluation of her intermittent gross hematuria  Urinalysis today is completely benign   PMH: Past Medical History:  Diagnosis Date   Diabetes mellitus without complication (HCC)    Pulmonary embolism (HCC)     Surgical History: Past Surgical History:  Procedure Laterality Date   ABDOMINAL HYSTERECTOMY     08/2012 Partial   c sections     NASAL SINUS SURGERY      Family History: Family History  Problem Relation Age of Onset   Diabetes Mother    Hypertension Mother    Diabetes Father    Hypertension Father     Social History:  reports that she has never smoked. She has never used smokeless tobacco. She reports that she does not drink alcohol and does not use drugs.  Physical Exam: BP 132/84   Pulse 84   Ht 5' 3 (1.6 m)   Wt 230 lb (104.3 kg)   BMI 40.74 kg/m    Constitutional:  Alert and oriented, No acute distress. Cardiovascular: No clubbing, cyanosis, or edema. Respiratory: Normal respiratory effort, no increased work of breathing. GI: Abdomen is soft,  nontender, nondistended, no abdominal masses   Laboratory Data: Reviewed, see HPI  Assessment & Plan:   48 year old female with intermittent gross hematuria, as well as bilateral back pain, history of reported 1.5 cm left renal stone potentially located within a excluded calyx or parapelvic cyst based on the outside radiology reports.  She underwent ureteroscopy at [facility] in 2022, and they were unable to identify the stone endoscopically at that time, and it appeared to be separate from the collecting system.  She also had a ureteral injury at that time from sheath placement and required prolonged stent placement.  No recent imaging to review.  I reviewed the outside notes from [facility] extensively.  I recommended further evaluation of her gross hematuria with a CT urogram, and follow-up in person to review those results.  Consider cystoscopy in the future pending CT findings.   Redell Burnet, MD 11/17/2023  [facility] [address] [place], [place] [address] [phone]

## 2023-12-15 ENCOUNTER — Ambulatory Visit: Payer: Self-pay | Admitting: Urology
